# Patient Record
Sex: Male | Born: 1990 | Marital: Married | Smoking: Never smoker
Health system: Southern US, Community
[De-identification: ages and names within clinical notes are randomized; demographics above are authoritative.]

## PROBLEM LIST (undated history)

## (undated) DIAGNOSIS — L509 Urticaria, unspecified: Secondary | ICD-10-CM

## (undated) HISTORY — DX: Urticaria, unspecified: L50.9

---

## 2018-07-14 ENCOUNTER — Encounter: Payer: Self-pay | Admitting: Allergy and Immunology

## 2018-07-14 ENCOUNTER — Ambulatory Visit (INDEPENDENT_AMBULATORY_CARE_PROVIDER_SITE_OTHER): Payer: 59 | Admitting: Allergy and Immunology

## 2018-07-14 VITALS — BP 110/70 | Temp 98.5°F | Resp 18 | Ht 68.5 in | Wt 199.4 lb

## 2018-07-14 DIAGNOSIS — K2970 Gastritis, unspecified, without bleeding: Secondary | ICD-10-CM | POA: Diagnosis not present

## 2018-07-14 DIAGNOSIS — L500 Allergic urticaria: Secondary | ICD-10-CM

## 2018-07-14 DIAGNOSIS — J310 Chronic rhinitis: Secondary | ICD-10-CM | POA: Diagnosis not present

## 2018-07-14 DIAGNOSIS — T7840XA Allergy, unspecified, initial encounter: Secondary | ICD-10-CM | POA: Insufficient documentation

## 2018-07-14 DIAGNOSIS — T7840XD Allergy, unspecified, subsequent encounter: Secondary | ICD-10-CM

## 2018-07-14 MED ORDER — FAMOTIDINE 20 MG PO TABS: 20.0000 mg | ORAL_TABLET | Freq: Two times a day (BID) | ORAL | 5 refills | Status: DC

## 2018-07-14 MED ORDER — LEVOCETIRIZINE DIHYDROCHLORIDE 5 MG PO TABS: 5.0000 mg | ORAL_TABLET | Freq: Every evening | ORAL | 5 refills | Status: DC

## 2018-07-14 NOTE — Assessment & Plan Note (Signed)
Unclear etiology. Skin tests to select food allergens were negative today. NSAIDs and emotional stress commonly exacerbate urticaria but are not the underlying etiology in this case. Physical urticarias are negative by history (i.e. pressure-induced, temperature, vibration, solar, etc.). History and lesions are not consistent with urticaria pigmentosa so I am not suspicious for mastocytosis. There are no concomitant symptoms concerning for anaphylaxis or constitutional symptoms worrisome for an underlying malignancy. We will rule out other potential etiologies with labs. For symptom relief, patient is to take oral antihistamines as directed.  The following labs have been ordered: FCeRI antibody, anti-thyroglobulin antibody, thyroid peroxidase antibody, tryptase, urea breath test, CBC, CMP, ESR, ANA, and galactose-alpha-1,3-galactose IgE level.  The patient will be called with further recommendations after lab results have returned.  Instructions have been discussed and provided for H1/H2 receptor blockade with titration to find lowest effective dose.  A prescription has been provided for levocetirizine (Xyzal), 5 mg daily as needed.  A prescription has been provided for famotidine (Pepcid) 20 mg twice daily as needed.  Should there be a significant increase or change in symptoms, a journal is to be kept recording any foods eaten, beverages consumed, medications taken within a 6 hour period prior to the onset of symptoms, as well as record activities being performed, and environmental conditions. For any symptoms concerning for anaphylaxis, 911 is to be called immediately. 

## 2018-07-14 NOTE — Patient Instructions (Addendum)
Allergic urticaria Unclear etiology. Skin tests to select food allergens were negative today. NSAIDs and emotional stress commonly exacerbate urticaria but are not the underlying etiology in this case. Physical urticarias are negative by history (i.e. pressure-induced, temperature, vibration, solar, etc.). History and lesions are not consistent with urticaria pigmentosa so I am not suspicious for mastocytosis. There are no concomitant symptoms concerning for anaphylaxis or constitutional symptoms worrisome for an underlying malignancy. We will rule out other potential etiologies with labs. For symptom relief, patient is to take oral antihistamines as directed.  The following labs have been ordered: FCeRI antibody, anti-thyroglobulin antibody, thyroid peroxidase antibody, tryptase, urea breath test, CBC, CMP, ESR, ANA, and galactose-alpha-1,3-galactose IgE level.  The patient will be called with further recommendations after lab results have returned.  Instructions have been discussed and provided for H1/H2 receptor blockade with titration to find lowest effective dose.  A prescription has been provided for levocetirizine (Xyzal), 5 mg daily as needed.  A prescription has been provided for famotidine (Pepcid) 20 mg twice daily as needed.  Should there be a significant increase or change in symptoms, a journal is to be kept recording any foods eaten, beverages consumed, medications taken within a 6 hour period prior to the onset of symptoms, as well as record activities being performed, and environmental conditions. For any symptoms concerning for anaphylaxis, 911 is to be called immediately.  Chronic rhinitis  Fluticasone nasal spray (Flonase), 2 sprays per nostril daily daily as needed.   Nasal saline spray (i.e., Simply Saline) or nasal saline lavage (i.e., NeilMed) as needed and prior to medicated nasal sprays.   When lab results have returned the patient will be called with further  recommendations and follow up instructions.   Urticaria (Hives)  . Levocetirizine (Xyzal) 5 mg twice a day and famotidine (Pepcid) 20 mg twice a day. If no symptoms for 7-14 days then decrease to. . Levocetirizine (Xyzal) 5 mg twice a day and famotidine (Pepcid) 20 mg once a day.  If no symptoms for 7-14 days then decrease to. . Levocetirizine (Xyzal) 5 mg twice a day.  If no symptoms for 7-14 days then decrease to. . Levocetirizine (Xyzal) 5 mg once a day.  May use Benadryl (diphenhydramine) as needed for breakthrough symptoms       If symptoms return, then step up dosage

## 2018-07-14 NOTE — Assessment & Plan Note (Signed)
   Fluticasone nasal spray (Flonase), 2 sprays per nostril daily daily as needed.   Nasal saline spray (i.e., Simply Saline) or nasal saline lavage (i.e., NeilMed) as needed and prior to medicated nasal sprays.

## 2018-07-14 NOTE — Progress Notes (Signed)
New Patient Note  RE: Charles Guerra MRN: [id number] DOB: 13-Apr-1991 Date of Office Visit: 07/14/2018  Referring provider: Jilda Panda, MD Primary care provider: Jilda Panda, MD  Chief Complaint: Urticaria; Rash; and Pruritus   History of present illness: Charles Guerra is a 27 y.o. male seen today in consultation requested by Jilda Panda, MD.  Over the past 5 months, [name] has experienced recurrent episodes of hives. The hives have appeared at different times over his face, neck, chest, back, arms, legs and hands.  The lesions are described as erythematous, raised, and pruritic.  Individual hives last less than 24 hours without leaving residual pigmentation or bruising. He denies concomitant angioedema, cardiopulmonary symptoms and GI symptoms. He has not experienced unexpected weight loss, recurrent fevers or drenching night sweats. No specific medication, food, skin care product, detergent, soap, or other environmental triggers have been identified. Seems to be worse when hot and sweaty but can occur any time. The symptoms do not seem to correlate with NSAIDs use or emotional stress. He did not have symptoms consistent with a respiratory tract infection at the time of symptom onset. [place]on has not yet tried any medications to control symptoms  He has not been evaluated and treated in the emergency department for these symptoms. Skin biopsy has not been performed. Recently, he has been experiencing hives daily. The patient experiences occasional nasal congestion, rhinorrhea, and postnasal drainage.  No significant seasonal symptom variation has been noted nor have specific environmental triggers been identified.  He does not take medications in an attempt to control the symptoms.  He has no history of symptoms consistent with asthma.  Assessment and plan: Allergic urticaria Unclear etiology. Skin tests to select food allergens were negative today. NSAIDs and  emotional stress commonly exacerbate urticaria but are not the underlying etiology in this case. Physical urticarias are negative by history (i.e. pressure-induced, temperature, vibration, solar, etc.). History and lesions are not consistent with urticaria pigmentosa so I am not suspicious for mastocytosis. There are no concomitant symptoms concerning for anaphylaxis or constitutional symptoms worrisome for an underlying malignancy. We will rule out other potential etiologies with labs. For symptom relief, patient is to take oral antihistamines as directed.  The following labs have been ordered: FCeRI antibody, anti-thyroglobulin antibody, thyroid peroxidase antibody, tryptase, urea breath test, CBC, CMP, ESR, ANA, and galactose-alpha-1,3-galactose IgE level.  The patient will be called with further recommendations after lab results have returned.  Instructions have been discussed and provided for H1/H2 receptor blockade with titration to find lowest effective dose.  A prescription has been provided for levocetirizine (Xyzal), 5 mg daily as needed.  A prescription has been provided for famotidine (Pepcid) 20 mg twice daily as needed.  Should there be a significant increase or change in symptoms, a journal is to be kept recording any foods eaten, beverages consumed, medications taken within a 6 hour period prior to the onset of symptoms, as well as record activities being performed, and environmental conditions. For any symptoms concerning for anaphylaxis, 911 is to be called immediately.  Chronic rhinitis  Fluticasone nasal spray (Flonase), 2 sprays per nostril daily daily as needed.   Nasal saline spray (i.e., Simply Saline) or nasal saline lavage (i.e., NeilMed) as needed and prior to medicated nasal sprays.   Meds ordered this encounter  Medications  . levocetirizine (XYZAL) 5 MG tablet    Sig: Take 1 tablet (5 mg total) by mouth every evening.    Dispense:  30 tablet  Refill:  5  .  famotidine (PEPCID) 20 MG tablet    Sig: Take 1 tablet (20 mg total) by mouth 2 (two) times daily.    Dispense:  30 tablet    Refill:  5    Diagnostics: Environmental skin testing: Negative despite a positive histamine control. Food allergen skin testing: Negative despite a positive histamine control.    Physical examination: Blood pressure 110/70, temperature 98.5 F (36.9 C), temperature source Oral, resp. rate 18, height 5' 8.5" (1.74 m), weight 199 lb 6.4 oz (90.4 kg).  General: Alert, interactive, in no acute distress. HEENT: TMs pearly gray, turbinates mildly edematous without discharge, post-pharynx mildly erythematous. Neck: Supple without lymphadenopathy. Lungs: Clear to auscultation without wheezing, rhonchi or rales. CV: Normal S1, S2 without murmurs. Abdomen: Nondistended, nontender. Skin: Scattered erythematous patches and urticarial type lesions primarily located flanks and upper back , nonvesicular. Extremities:  No clubbing, cyanosis or edema. Neuro:   Grossly intact.  Review of systems:  Review of systems negative except as noted in HPI / PMHx or noted below: Review of Systems  Constitutional: Negative.   HENT: Negative.   Eyes: Negative.   Respiratory: Negative.   Cardiovascular: Negative.   Gastrointestinal: Negative.   Genitourinary: Negative.   Musculoskeletal: Negative.   Skin: Negative.   Neurological: Negative.   Endo/Heme/Allergies: Negative.   Psychiatric/Behavioral: Negative.     Past medical history:  Past Medical History:  Diagnosis Date  . Urticaria     Past surgical history:  History reviewed. No pertinent surgical history.  Family history: Family History  Problem Relation Age of Onset  . Allergic rhinitis Neg Hx   . Asthma Neg Hx   . Eczema Neg Hx   . Urticaria Neg Hx     Social history: Social History   Socioeconomic History  . Marital status: Married    Spouse name: Not on file  . Number of children: Not on file  .  Years of education: Not on file  . Highest education level: Not on file  Occupational History  . Not on file  Social Needs  . Financial resource strain: Not on file  . Food insecurity:    Worry: Not on file    Inability: Not on file  . Transportation needs:    Medical: Not on file    Non-medical: Not on file  Tobacco Use  . Smoking status: Never Smoker  . Smokeless tobacco: Never Used  Substance and Sexual Activity  . Alcohol use: Not on file  . Drug use: Not on file  . Sexual activity: Not on file  Lifestyle  . Physical activity:    Days per week: Not on file    Minutes per session: Not on file  . Stress: Not on file  Relationships  . Social connections:    Talks on phone: Not on file    Gets together: Not on file    Attends religious service: Not on file    Active member of club or organization: Not on file    Attends meetings of clubs or organizations: Not on file    Relationship status: Not on file  . Intimate partner violence:    Fear of current or ex partner: Not on file    Emotionally abused: Not on file    Physically abused: Not on file    Forced sexual activity: Not on file  Other Topics Concern  . Not on file  Social History Narrative  . Not on file  Environmental History: The patient lives in an apartment with carpeting throughout and central air/heat.  There is no known mold/water damage in the home.  He is a non-smoker without pets.  Allergies as of 07/14/2018      Reactions   Penicillins       Medication List        Accurate as of 07/14/18  5:30 PM. Always use your most recent med list.          cholecalciferol 1000 units tablet Commonly known as:  VITAMIN D Take 1,000 Units by mouth daily.   famotidine 20 MG tablet Commonly known as:  PEPCID Take 1 tablet (20 mg total) by mouth 2 (two) times daily.   levocetirizine 5 MG tablet Commonly known as:  XYZAL Take 1 tablet (5 mg total) by mouth every evening.   Vitamin D (Ergocalciferol)  50000 units Caps capsule Commonly known as:  DRISDOL Take 50,000 Units by mouth once a week.       Known medication allergies: Allergies  Allergen Reactions  . Penicillins     I appreciate the opportunity to take part in Pruitt's care. Please do not hesitate to contact me with questions.  Sincerely,   R. Edgar Frisk, MD

## 2018-07-22 LAB — H. PYLORI BREATH TEST: H pylori Breath Test: POSITIVE — AB

## 2018-07-23 ENCOUNTER — Telehealth: Payer: Self-pay | Admitting: *Deleted

## 2018-07-23 MED ORDER — OMEPRAZOLE 20 MG PO CPDR: DELAYED_RELEASE_CAPSULE | ORAL | 0 refills | Status: DC

## 2018-07-23 MED ORDER — CLARITHROMYCIN 500 MG PO TABS: ORAL_TABLET | ORAL | 0 refills | Status: DC

## 2018-07-23 MED ORDER — METRONIDAZOLE 500 MG PO TABS: ORAL_TABLET | ORAL | 0 refills | Status: DC

## 2018-07-23 NOTE — Telephone Encounter (Signed)
Patient return called in regards to lab work results reviewed medication regimen reviewed and prescriptions sent in. Patient verbalized understanding

## 2018-07-23 NOTE — Telephone Encounter (Signed)
-----   Message from Cristal Ford, MD sent at 07/22/2018  5:24 PM EDT ----- Please inform the patient that he is H. pylori positive.  This lab result came in after the other results have been returned.  His chart indicates that he is penicillin allergic.  Please confirm with the patient that this is true.  If he is penicillin allergic please provide prescriptions for omeprazole 20 mg twice a day 14 days, metronidazole 500 mg twice a day 14 days, and clarithromycin 500 mg twice a day 14 days.  Please send lab results to the patient's primary care physician and referring physician.  Thank you.

## 2018-07-25 LAB — ALPHA-GAL PANEL
Alpha Gal IgE*: <0.1 kU/L (ref <0.10)
Beef (Bos spp) IgE: <0.1 kU/L (ref <0.35)
Class Interpretation: 0
Class Interpretation: 0
Class Interpretation: 0
Lamb/Mutton (Ovis spp) IgE: <0.1 kU/L (ref <0.35)
Pork (Sus spp) IgE: <0.1 kU/L (ref <0.35)

## 2018-07-25 LAB — COMPREHENSIVE METABOLIC PANEL
ALT: 34 IU/L (ref 0–44)
AST: 26 IU/L (ref 0–40)
Albumin/Globulin Ratio: 2.3 — ABNORMAL HIGH (ref 1.2–2.2)
Albumin: 4.6 g/dL (ref 3.5–5.5)
Alkaline Phosphatase: 75 IU/L (ref 39–117)
BUN/Creatinine Ratio: 15 (ref 9–20)
BUN: 12 mg/dL (ref 6–20)
Bilirubin Total: 0.6 mg/dL (ref 0.0–1.2)
CO2: 21 mmol/L (ref 20–29)
Calcium: 9.2 mg/dL (ref 8.7–10.2)
Chloride: 102 mmol/L (ref 96–106)
Creatinine, Ser: 0.79 mg/dL (ref 0.76–1.27)
GFR calc Af Amer: 142 mL/min/{1.73_m2} (ref >59)
GFR calc non Af Amer: 123 mL/min/{1.73_m2} (ref >59)
Globulin, Total: 2 g/dL (ref 1.5–4.5)
Glucose: 90 mg/dL (ref 65–99)
Potassium: 4 mmol/L (ref 3.5–5.2)
Sodium: 140 mmol/L (ref 134–144)
Total Protein: 6.6 g/dL (ref 6.0–8.5)

## 2018-07-25 LAB — SEDIMENTATION RATE: Sed Rate: 4 mm/hr (ref 0–15)

## 2018-07-25 LAB — TRYPTASE: Tryptase: 7.1 ug/L (ref 2.2–13.2)

## 2018-07-25 LAB — CBC
Hematocrit: 47 % (ref 37.5–51.0)
Hemoglobin: 16 g/dL (ref 13.0–17.7)
MCH: 28.8 pg (ref 26.6–33.0)
MCHC: 34 g/dL (ref 31.5–35.7)
MCV: 85 fL (ref 79–97)
Platelets: 219 10*3/uL (ref 150–450)
RBC: 5.56 x10E6/uL (ref 4.14–5.80)
RDW: 12.7 % (ref 12.3–15.4)
WBC: 6.6 10*3/uL (ref 3.4–10.8)

## 2018-07-25 LAB — ANA: ANA Titer 1: NEGATIVE

## 2018-08-10 ENCOUNTER — Telehealth: Payer: Self-pay | Admitting: Allergy and Immunology

## 2018-08-10 NOTE — Telephone Encounter (Signed)
Pt called and said that he was no better and need to talk with someone. [phone] [facility]

## 2018-08-10 NOTE — Telephone Encounter (Signed)
While there is an association between hives and H. pylori, his hives may or may not have been caused by the H. pylori.  Either way, the H. pylori needed to be eradicated because it can lead to problems down the road.  There are many people that have hives unrelated to the H. pylori.  Therefore, he should use antihistamines as needed to help control the hives, titrating to the lowest dose necessary to keep the hives under control.  Urticaria (Hives)  . Levocetirizine (Xyzal) 5 mg twice a day and famotidine (Pepcid) 20 mg twice a day. If no symptoms for 7-14 days then decrease to. . Levocetirizine (Xyzal) 5 mg twice a day and famotidine (Pepcid) 20 mg once a day.  If no symptoms for 7-14 days then decrease to. . Levocetirizine (Xyzal) 5 mg twice a day.  If no symptoms for 7-14 days then decrease to. . Levocetirizine (Xyzal) 5 mg once a day.  May use Benadryl (diphenhydramine) as needed for breakthrough symptoms       If symptoms return, then step up dosage

## 2018-08-10 NOTE — Telephone Encounter (Signed)
Spoke with patient and he is still breaking out even after completing the treatment for H. Pylori. I asked patient if he was doing to the antihistamine regimen that Dr. Nunzio Cobbs suggested and he started he was taking the Levocetirizine every other night. I informed patient he should take it every day and see if things improve. Patient stated that those were for an allergic reaction and his underlying problem was H. Pylori and he didn't feel that was going to help his H. Pylori. Please advise on next step.

## 2018-08-14 NOTE — Telephone Encounter (Signed)
Left message for patient to call office.  

## 2018-08-14 NOTE — Telephone Encounter (Signed)
Spoke with patient and discussed H. Pylori and hives per Dr. Sheran Fava note.   Reviewed in detail urticaria and Levocetirizine and Pepcid along with when to increase and when to decrease medication.  Patient will call the office back if he can't find his copy of last after visit summary that has urticaria treatment written in detail by Dr. Nunzio Cobbs so we can mail him another copy.  Patient stated he was out of Levocetirizine and Pepcid and he was informed that there were refills sent in to pharmacy at time of original RX.  Patient will call pharmacy for refills.  Patient voiced understanding.

## 2018-09-03 ENCOUNTER — Ambulatory Visit (INDEPENDENT_AMBULATORY_CARE_PROVIDER_SITE_OTHER): Payer: 59 | Admitting: Allergy

## 2018-09-03 ENCOUNTER — Encounter: Payer: Self-pay | Admitting: Allergy

## 2018-09-03 VITALS — BP 100/66 | HR 100 | Resp 18

## 2018-09-03 DIAGNOSIS — L508 Other urticaria: Secondary | ICD-10-CM | POA: Insufficient documentation

## 2018-09-03 MED ORDER — FAMOTIDINE 20 MG PO TABS: 20.0000 mg | ORAL_TABLET | Freq: Two times a day (BID) | ORAL | 5 refills | Status: DC

## 2018-09-03 MED ORDER — LEVOCETIRIZINE DIHYDROCHLORIDE 5 MG PO TABS: 5.0000 mg | ORAL_TABLET | Freq: Every evening | ORAL | 5 refills | Status: DC

## 2018-09-03 NOTE — Patient Instructions (Addendum)
Continue levocetirizine 5mg  in the evening and levocetirizine 2.5mg  in the morning.  Take pepcid 20mg  twice a day  Avoid the following potential triggers: alcohol, tight clothing, NSAIDs.   Follow up in 4 weeks.

## 2018-09-03 NOTE — Assessment & Plan Note (Signed)
Past history - Unclear etiology. Skin tests to select food allergens were negative in 2019.  Labs 2019: tryptase, CBC, CMP, ESR, ANA, and galactose-alpha-1,3-galactose IgE level - all unremarkble. He did have elevated urea breath test and treated for H.pylori.  Currently on xyzal '5mg'$  QHS with good benefit however still gets few hives lasting about 30 minutes. Most likely has chronic idiopathic urticaria.  Continue levocetirizine '5mg'$  in the evening and levocetirizine 2.'5mg'$  in the morning.  Take pepcid '20mg'$  twice a day  Avoid the following potential triggers: alcohol, tight clothing, NSAIDs.

## 2018-09-03 NOTE — Progress Notes (Signed)
Follow Up Note  RE: Charles Guerra MRN: [id number] DOB: 1990/12/07 Date of Office Visit: 09/03/2018  Referring provider: Jilda Panda, MD Primary care provider: Jilda Panda, MD  Chief Complaint: Rash  History of Present Illness: I had the pleasure of seeing Teruo Stubbe for a follow up visit at the [facility] on 09/03/2018. He is a 27 y.o. male, who is being followed for urticaria, rhinitis. Today he is here for new complaint of rash. His previous allergy office visit was on 07/14/2018 with Dr. Verlin Fester.   Patient is still complaining of rash and pruritus. He did finish the H.pylori treatment and does not complain of any GI issues.   He is taking xyzal 69m daily at night and it helps with the pruritus. The hives usually take about 30 minutes to resolve. Xyzal does cause some drowsiness.   This has been going on for 5-6 months. Denies any changes in diet, medications. No triggers noted.   Assessment and Plan: HLambrosis a 27y.o. male with: Chronic urticaria Past history - Unclear etiology. Skin tests to select food allergens were negative in 2019.  Labs 2019: tryptase, CBC, CMP, ESR, ANA, and galactose-alpha-1,3-galactose IgE level - all unremarkble. He did have elevated urea breath test and treated for H.pylori.  Currently on xyzal 567mQHS with good benefit however still gets few hives lasting about 30 minutes. Most likely has chronic idiopathic urticaria.  Continue levocetirizine 51m67mn the evening and levocetirizine 2.51mg29m the morning.  Take pepcid 20mg31mce a day  Avoid the following potential triggers: alcohol, tight clothing, NSAIDs.  Return in about 4 weeks (around 10/01/2018).  Meds ordered this encounter  Medications  . levocetirizine (XYZAL) 5 MG tablet    Sig: Take 1 tablet (5 mg total) by mouth every evening. Take 1/2 tablet in the morning and 1 tablet in the evening.    Dispense:  45 tablet    Refill:  5  .  famotidine (PEPCID) 20 MG tablet    Sig: Take 1 tablet (20 mg total) by mouth 2 (two) times daily.    Dispense:  30 tablet    Refill:  5   Diagnostics: None.  Medication List:  Current Outpatient Medications  Medication Sig Dispense Refill  . levocetirizine (XYZAL) 5 MG tablet Take 1 tablet (5 mg total) by mouth every evening. Take 1/2 tablet in the morning and 1 tablet in the evening. 45 tablet 5  . famotidine (PEPCID) 20 MG tablet Take 1 tablet (20 mg total) by mouth 2 (two) times daily. 30 tablet 5   No current facility-administered medications for this visit.    Allergies: Allergies  Allergen Reactions  . Penicillins    I reviewed his past medical history, social history, family history, and environmental history and no significant changes have been reported from previous visit on 07/14/2018.  Review of Systems  Constitutional: Negative for appetite change, chills, fever and unexpected weight change.  HENT: Negative for congestion and rhinorrhea.   Eyes: Negative for itching.  Respiratory: Negative for cough, chest tightness, shortness of breath and wheezing.   Gastrointestinal: Negative for abdominal pain.  Skin: Positive for rash.  Allergic/Immunologic: Positive for environmental allergies.  Neurological: Negative for headaches.   Objective: BP 100/66 (BP Location: Right Arm, Patient Position: Sitting, Cuff Size: Normal)   Pulse 100   Resp 18   SpO2 96%  There is no height or weight on file to calculate BMI. Physical Exam  Constitutional: He  is oriented to person, place, and time. He appears well-developed and well-nourished.  HENT:  Head: Normocephalic and atraumatic.  Right Ear: External ear normal.  Left Ear: External ear normal.  Nose: Nose normal.  Mouth/Throat: Oropharynx is clear and moist.  Eyes: Conjunctivae and EOM are normal.  Neck: Neck supple.  Cardiovascular: Normal rate, regular rhythm and normal heart sounds. Exam reveals no gallop and no friction  rub.  No murmur heard. Pulmonary/Chest: Effort normal and breath sounds normal. He has no wheezes. He has no rales.  Neurological: He is alert and oriented to person, place, and time.  Skin: Skin is warm. No rash noted.  Psychiatric: He has a normal mood and affect. His behavior is normal.  Nursing note and vitals reviewed.  Previous notes and tests were reviewed. The plan was reviewed with the patient/family, and all questions/concerned were addressed.  It was my pleasure to see Charles Guerra today and participate in his care. Please feel free to contact me with any questions or concerns.  Sincerely,  Rexene Alberts, DO Allergy & Immunology  [facility] office: [phone] [place]

## 2018-10-01 ENCOUNTER — Ambulatory Visit (INDEPENDENT_AMBULATORY_CARE_PROVIDER_SITE_OTHER): Payer: 59 | Admitting: Allergy

## 2018-10-01 ENCOUNTER — Encounter: Payer: Self-pay | Admitting: Allergy

## 2018-10-01 VITALS — BP 104/82 | HR 89 | Resp 12

## 2018-10-01 DIAGNOSIS — L508 Other urticaria: Secondary | ICD-10-CM | POA: Diagnosis not present

## 2018-10-01 NOTE — Patient Instructions (Addendum)
Chronic urticaria   Take xyzal 5mg  at night and switch the morning dose to allegra 1/2 pill to see if it makes you tired.  Take pepcid 20mg  twice a day  Avoid the following potential triggers: alcohol, tight clothing, NSAIDs.  If no rash, itching or hives for 1 month then may try stop the morning antihistamine dose but continue the xyzal 5mg  at night.  Follow up in 3 months.

## 2018-10-01 NOTE — Assessment & Plan Note (Signed)
Past history - Unclear etiology. Skin tests to select food allergens were negative in 2019.  Labs 2019: tryptase, CBC, CMP, ESR, ANA, and galactose-alpha-1,3-galactose IgE level - all unremarkble. He did have elevated urea breath test and treated for H.pylori. Interim history - Doing well with below regimen but hives flare if misses a xyzal dose.   Take xyzal '5mg'$  at night and switch the morning dose to allegra 1/2 pill to see if it makes you tired.  Take pepcid '20mg'$  twice a day  Avoid the following potential triggers: alcohol, tight clothing, NSAIDs.  If no rash, itching or hives for 1 month then may try stop the morning antihistamine dose but continue the xyzal '5mg'$  at night.

## 2018-10-01 NOTE — Progress Notes (Signed)
Follow Up Note  RE: Charles Guerra MRN: [id number] DOB: 11-Jun-1991 Date of Office Visit: 10/01/2018  Referring provider: Jilda Panda, MD Primary care provider: Jilda Panda, MD  Chief Complaint: Urticaria  History of Present Illness: I had the pleasure of seeing Charles Guerra for a follow up visit at the [facility] on 10/01/2018. He is a 27 y.o. male, who is being followed for urticaria. Today he is here for regular follow up visit. His previous allergy office visit was on 09/03/2018 with Dr. Maudie Mercury.   Currently on Xyzal 2.3m in the morning and 528min the evening and doing well. However if he misses a dose then noticed that the hives return the same day. The morning dose does sometimes makes him tired. Still taking Pepcid 2033mID.  Assessment and Plan: HasKoi a [age]o. male with: Chronic urticaria Past history - Unclear etiology. Skin tests to select food allergens were negative in 2019.  Labs 2019: tryptase, CBC, CMP, ESR, ANA, and galactose-alpha-1,3-galactose IgE level - all unremarkble. He did have elevated urea breath test and treated for H.pylori. Interim history - Doing well with below regimen but hives flare if misses a xyzal dose.   Take xyzal 5mg16m night and switch the morning dose to allegra 1/2 pill to see if it makes you tired.  Take pepcid 20mg39mce a day  Avoid the following potential triggers: alcohol, tight clothing, NSAIDs.  If no rash, itching or hives for 1 month then may try stop the morning antihistamine dose but continue the xyzal 5mg a26might.  Return in about 3 months (around 12/31/2018).  Diagnostics: None.  Medication List:  Current Outpatient Medications  Medication Sig Dispense Refill  . famotidine (PEPCID) 20 MG tablet Take 1 tablet (20 mg total) by mouth 2 (two) times daily. 30 tablet 5  . levocetirizine (XYZAL) 5 MG tablet Take 1 tablet (5 mg total) by mouth every evening. Take 1/2 tablet in the  morning and 1 tablet in the evening. 45 tablet 5   No current facility-administered medications for this visit.    Allergies: Allergies  Allergen Reactions  . Penicillins    I reviewed his past medical history, social history, family history, and environmental history and no significant changes have been reported from previous visit on 09/03/2018.  Review of Systems  Constitutional: Negative for appetite change, chills, fever and unexpected weight change.  HENT: Negative for congestion and rhinorrhea.   Eyes: Negative for itching.  Respiratory: Negative for cough, chest tightness, shortness of breath and wheezing.   Gastrointestinal: Negative for abdominal pain.  Skin: Positive for rash.  Allergic/Immunologic: Positive for environmental allergies.  Neurological: Negative for headaches.   Objective: BP 104/82 (BP Location: Left Arm, Patient Position: Sitting, Cuff Size: Normal)   Pulse 89   Resp 12   SpO2 94%  There is no height or weight on file to calculate BMI. Physical Exam  Constitutional: He is oriented to person, place, and time. He appears well-developed and well-nourished.  HENT:  Head: Normocephalic and atraumatic.  Right Ear: External ear normal.  Left Ear: External ear normal.  Nose: Nose normal.  Mouth/Throat: Oropharynx is clear and moist.  Eyes: Conjunctivae and EOM are normal.  Neck: Neck supple.  Cardiovascular: Normal rate, regular rhythm and normal heart sounds. Exam reveals no gallop and no friction rub.  No murmur heard. Pulmonary/Chest: Effort normal and breath sounds normal. He has no wheezes. He has no rales.  Neurological: He  is alert and oriented to person, place, and time.  Skin: Skin is warm. No rash noted.  Psychiatric: He has a normal mood and affect. His behavior is normal.  Nursing note and vitals reviewed.  Previous notes and tests were reviewed. The plan was reviewed with the patient/family, and all questions/concerned were  addressed.  It was my pleasure to see Charles Guerra today and participate in his care. Please feel free to contact me with any questions or concerns.  Sincerely,  Rexene Alberts, DO Allergy & Immunology  [facility] office: [phone] [facility] office: [phone]

## 2018-12-28 ENCOUNTER — Ambulatory Visit: Payer: 59 | Admitting: Allergy

## 2019-07-24 ENCOUNTER — Other Ambulatory Visit: Payer: Self-pay | Admitting: Allergy and Immunology

## 2019-07-26 NOTE — Telephone Encounter (Signed)
Gave 1 courtesy refill of levocetirizine- pt needs ov for any additional refills.

## 2019-08-12 ENCOUNTER — Telehealth: Payer: Self-pay | Admitting: Allergy and Immunology

## 2019-08-12 NOTE — Telephone Encounter (Signed)
Pt called and is having a lot problems with the rash and itching and his eyes are red and itching too. He said that he has been paying a bill with [place] but he did not get well. He is a little frustrated each month the bill comes in and he still not any better. I told him we would see if we can find out what is going on with him. The pharmacy [facility] on [address] [phone].

## 2019-08-12 NOTE — Telephone Encounter (Signed)
Patsy, it looks like Dr. Maudie Mercury has seen this patient the last 2 times.

## 2019-08-17 NOTE — Telephone Encounter (Signed)
Patient is calling to request a telephone call from the doctor. He verbalizes that he received a $1700 bill and feels that he did not receive any benefit from his 2019 visits to our practice. He refuses to schedule an office visit at this time verbalizing that he will not pay for it. He would strongly like to receive a call from one of the two doctors that he saw to discuss this matter. Please advise.

## 2019-08-17 NOTE — Telephone Encounter (Signed)
Please Advise

## 2019-08-17 NOTE — Telephone Encounter (Signed)
Dr. Kim please advise 

## 2019-08-18 NOTE — Telephone Encounter (Signed)
Charles Guerra,  Can you look into this?  I last saw him for regular OV on 10/01/2018 and 09/03/2018. I did not order any type of testing at that visit. Initial work up was done on 07/14/2018 by Dr. Verlin Fester.  It's been almost a year since the last follow up. I did recommend a follow up in 3 months after the last visit. I'm not sure how to address this.  Thank you.

## 2019-08-24 NOTE — Telephone Encounter (Signed)
Based on the initial evaluation, lab work was significant only for a positive H. pylori.  The H. pylori was treated based upon my recommendation.  I never saw him after that, Dr. Maudie Mercury did.  It appears that there are options, such as Xolair, which have not been explored.  It probably should have been explained to him as well that if he has chronic idiopathic urticaria there is no "cure", just a hope for remission.

## 2019-08-24 NOTE — Telephone Encounter (Signed)
Patient was in my referral workqueue for [facility]. I reached out to patient to schedule a follow up visit. He is very upset and says that he was told a few weeks ago that he would get a call back from Dr.Bobbitt regarding his care plan. Patient states that he received a bill for $1700.00 and he doesn't feel like he should have to pay it when he's had no relief what so ever. Patient says that he is not scheduling a follow up just to received another large bill when our treatment plan didn't work in the first place. I reassured the patient that the physicians are aware of his call and hopefully he will hear something soon. I asked him if he wanted me to close out his referral since he is refusing an appointment and he hung up on me.

## 2019-08-24 NOTE — Telephone Encounter (Signed)
I'm confused why he was told he would receive a call back from me when Dr. Maudie Mercury has seen him the last 2 times. Does the bill have to do with the initial visit or is it the cumulative bill?

## 2020-11-23 ENCOUNTER — Ambulatory Visit: Payer: Self-pay

## 2020-11-24 ENCOUNTER — Ambulatory Visit: Payer: Self-pay

## 2021-01-18 ENCOUNTER — Encounter: Payer: Self-pay | Admitting: Infectious Disease

## 2021-01-18 ENCOUNTER — Other Ambulatory Visit: Payer: Self-pay

## 2021-01-18 ENCOUNTER — Ambulatory Visit (INDEPENDENT_AMBULATORY_CARE_PROVIDER_SITE_OTHER): Payer: Managed Care, Other (non HMO) | Admitting: Infectious Disease

## 2021-01-18 DIAGNOSIS — R1013 Epigastric pain: Secondary | ICD-10-CM

## 2021-01-18 DIAGNOSIS — R059 Cough, unspecified: Secondary | ICD-10-CM

## 2021-01-18 DIAGNOSIS — J029 Acute pharyngitis, unspecified: Secondary | ICD-10-CM | POA: Diagnosis not present

## 2021-01-18 DIAGNOSIS — A048 Other specified bacterial intestinal infections: Secondary | ICD-10-CM

## 2021-01-18 DIAGNOSIS — J310 Chronic rhinitis: Secondary | ICD-10-CM

## 2021-01-18 DIAGNOSIS — Z880 Allergy status to penicillin: Secondary | ICD-10-CM

## 2021-01-18 HISTORY — DX: Cough, unspecified: R05.9

## 2021-01-18 HISTORY — DX: Acute pharyngitis, unspecified: J02.9

## 2021-01-18 HISTORY — DX: Other specified bacterial intestinal infections: A04.8

## 2021-01-18 LAB — POC COVID19 BINAXNOW: SARS Coronavirus 2 Ag: NEGATIVE

## 2021-01-18 NOTE — Progress Notes (Signed)
Subjective:  Reason for infectious disease consult recurrent Helicobacter pylori infection  Requesting physician: Jeani Hawking, MD  Chief complaint dyspepsia long with sore throat and a cough  Patient ID: Charles Guerra, male    DOB: 02/23/1991, 30 y.o.   MRN: [id number]  HPI   Charles Guerra is very pleasant man born in [place] who has had trouble with recurrent, persistent Helicobacter pylori infection since 2011.  He does not recall what he was treated with in 2011 but states that in 2017 he was treated with levofloxacin Flagyl omeprazole and bismuth this was while he was living in [place].  More recently he has been living in [place].  While here he has been diagnosed with H. pylori in 2019 and treated with therapy at that time which he believes might of involved tetracycline bismuth PPI metronidazole.  He was again diagnosed with H. pylori this September when he underwent EGD with biopsy was positive for H. pylori.  He was treated with quadruple therapy for 14 days.  Not clear to me what he was treated with then there was mention of treating with third line with levofloxacin PPI metronidazole.  There is note in the follow-up of medications that were active that were bismuth subsalicylate metronidazole omeprazole and doxycycline.  While he said he had an allergic reaction to penicillin when he was a child with swelling of his eyes and a rash he has taken amoxicillin and has had amoxicillin several times as part of regimens to treat H. pylori.  Still suffering from dyspepsia today and says he has trouble digesting things.  When he came to clinic he also first onset of cough today and a sore throat which he believes is due to seasonal allergies.  We did a rapid Covid antigen test here in the clinic which was negative.  Past Medical History:  Diagnosis Date  . Helicobacter pylori (H. pylori) infection 01/18/2021  . Urticaria     No past surgical history on file.  Family  History  Problem Relation Age of Onset  . Allergic rhinitis Neg Hx   . Asthma Neg Hx   . Eczema Neg Hx   . Urticaria Neg Hx       Social History   Socioeconomic History  . Marital status: Married    Spouse name: Not on file  . Number of children: Not on file  . Years of education: Not on file  . Highest education level: Not on file  Occupational History  . Not on file  Tobacco Use  . Smoking status: Never Smoker  . Smokeless tobacco: Never Used  Vaping Use  . Vaping Use: Never used  Substance and Sexual Activity  . Alcohol use: Not on file  . Drug use: Not on file  . Sexual activity: Not on file  Other Topics Concern  . Not on file  Social History Narrative  . Not on file   Social Determinants of Health   Financial Resource Strain: Not on file  Food Insecurity: Not on file  Transportation Needs: Not on file  Physical Activity: Not on file  Stress: Not on file  Social Connections: Not on file    Allergies  Allergen Reactions  . Penicillins      Current Outpatient Medications:  .  famotidine (PEPCID) 20 MG tablet, Take 1 tablet (20 mg total) by mouth 2 (two) times daily., Disp: 30 tablet, Rfl: 5 .  levocetirizine (XYZAL) 5 MG tablet, TAKE 1 TABLET(5 MG) BY MOUTH EVERY EVENING, Disp:  30 tablet, Rfl: 0   Review of Systems  Constitutional: Negative for activity change, appetite change, chills, diaphoresis, fatigue, fever and unexpected weight change.  HENT: Positive for sore throat. Negative for congestion, rhinorrhea, sinus pressure, sneezing and trouble swallowing.   Eyes: Negative for photophobia and visual disturbance.  Respiratory: Positive for cough. Negative for chest tightness, shortness of breath, wheezing and stridor.   Cardiovascular: Negative for chest pain, palpitations and leg swelling.  Gastrointestinal: Positive for abdominal pain. Negative for abdominal distention, anal bleeding, blood in stool, constipation, diarrhea, nausea and vomiting.   Genitourinary: Negative for difficulty urinating, dysuria, flank pain and hematuria.  Musculoskeletal: Negative for arthralgias, back pain, gait problem, joint swelling and myalgias.  Skin: Negative for color change, pallor, rash and wound.  Neurological: Negative for dizziness, tremors, weakness and light-headedness.  Hematological: Negative for adenopathy. Does not bruise/bleed easily.  Psychiatric/Behavioral: Negative for agitation, behavioral problems, confusion, decreased concentration, dysphoric mood and sleep disturbance.       Objective:   Physical Exam Constitutional:      General: He is not in acute distress.    Appearance: Normal appearance. He is well-developed. He is not ill-appearing or diaphoretic.  HENT:     Head: Normocephalic and atraumatic.     Right Ear: Hearing and external ear normal.     Left Ear: Hearing and external ear normal.     Nose: No nasal deformity or rhinorrhea.  Eyes:     General: No scleral icterus.    Extraocular Movements: Extraocular movements intact.     Conjunctiva/sclera: Conjunctivae normal.     Right eye: Right conjunctiva is not injected.     Left eye: Left conjunctiva is not injected.  Neck:     Vascular: No JVD.  Cardiovascular:     Rate and Rhythm: Normal rate and regular rhythm.     Heart sounds: S1 normal and S2 normal. Friction rub present.  Pulmonary:     Effort: Pulmonary effort is normal. No respiratory distress.  Abdominal:     General: Bowel sounds are normal. There is no distension.     Palpations: Abdomen is soft.  Musculoskeletal:        General: Normal range of motion.     Right shoulder: Normal.     Left shoulder: Normal.     Cervical back: Normal range of motion and neck supple.     Right hip: Normal.     Left hip: Normal.     Right knee: Normal.     Left knee: Normal.  Lymphadenopathy:     Head:     Right side of head: No submandibular, preauricular or posterior auricular adenopathy.     Left side of head:  No submandibular, preauricular or posterior auricular adenopathy.     Cervical: No cervical adenopathy.     Right cervical: No superficial or deep cervical adenopathy.    Left cervical: No superficial or deep cervical adenopathy.  Skin:    General: Skin is warm and dry.     Coloration: Skin is not pale.     Findings: No abrasion, bruising, ecchymosis, erythema, lesion or rash.     Nails: There is no clubbing.  Neurological:     Mental Status: He is alert and oriented to person, place, and time.     Sensory: No sensory deficit.     Coordination: Coordination normal.     Gait: Gait normal.  Psychiatric:        Attention and Perception: He is  attentive.        Mood and Affect: Mood normal.        Speech: Speech normal.        Behavior: Behavior normal. Behavior is cooperative.        Thought Content: Thought content normal.        Judgment: Judgment normal.           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  I have asked Sharin Mons whenever infectious disease pharmacist to review the regimens he was prescribed at the pharmacy [facility] that he has been feeling in [place] since 2018.   Labs we can come up with a salvage regimen to attempt with him.  I think ultimately though the best option for him to obtain a rational regimen will need to be a repeat EGD with biopsy for Helicobacter pylori on specialized media.  I have previously had difficulty being able to have this accomplished here in [place] working with local gastroenterologist and with her local labs though my partner Dr. Orvan Falconer has been successful on one occasion recently with sending a specimen for culture and sensitivity testing.  Cough and sore throat: Tested for Covid by rapid antigen which was negative and PCR sent this is indeed likely due to seasonal allergies  Penicillin allergy: Unlikely to be penicillin allergic anymore he  certainly has been tolerating amoxicillin by his account.  I spent greater than 80 minutes with the patient including greater than 50% of time in face to face counsel of the patient re the above problems, testing for COVID 19  and in coordination of his care with ID pharmacy.

## 2021-01-18 NOTE — Patient Instructions (Signed)
Our ID pharmacist will work on constructing your antibiotic history and together we hope we can construct a regimen for you to take. I suspect we will need Dr. Elnoria Howard to perform EGD with biopsy for H pylori and send for specialized culture with sensitivities

## 2021-01-19 ENCOUNTER — Other Ambulatory Visit (HOSPITAL_COMMUNITY): Payer: Self-pay

## 2021-01-19 ENCOUNTER — Telehealth (HOSPITAL_COMMUNITY): Payer: Self-pay | Admitting: Pharmacist

## 2021-01-19 LAB — SARS-COV-2 RNA,(COVID-19) QUALITATIVE NAAT: SARS CoV2 RNA: NOT DETECTED

## 2021-01-19 NOTE — Progress Notes (Signed)
Infectious Diseases Pharmacy Note   I was asked by Dr. Daiva Eves to gather a H pylori treatment history for Charles Guerra. I spoke with [facility] on [address] and they faxed me his medication profile. Unfortunately their records only go back 2 years.    From his pharmacy records it appears that he has received the following H pylori treatment regimens:  08/07/20 Levofloxacin 500 mg daily + Metronidazole 500mg  BID + PPI X 14 days   06/22/20  Bismouth subsalicylate + Doxycycline 100 mg BID + Metronidazole 500 MG BID+ PPI  I spoke with Dr. 08/22/20 and we will try another salvage regimen with rifabutin.   Plan Rifabutin 300 mg daily + Amoxicillin 750 mg TID + Omeprazole 40 BID  Our Pharmacy Technician, Daiva Eves, checked his copay on rifabutin and it will be $5.00.    [name], PharmD, BCPS, BCIDP Infectious Diseases Clinical Pharmacist Phone: [phone] 01/19/2021 12:48 PM

## 2021-01-23 ENCOUNTER — Other Ambulatory Visit: Payer: Self-pay | Admitting: Infectious Disease

## 2021-01-23 MED ORDER — AMOXICILLIN 250 MG PO CAPS: 750.0000 mg | ORAL_CAPSULE | Freq: Two times a day (BID) | ORAL | 0 refills | Status: AC

## 2021-01-23 MED ORDER — OMEPRAZOLE 40 MG PO CPDR: 40.0000 mg | DELAYED_RELEASE_CAPSULE | Freq: Two times a day (BID) | ORAL | 1 refills | Status: DC

## 2021-01-23 MED ORDER — RIFABUTIN 150 MG PO CAPS: 300.0000 mg | ORAL_CAPSULE | Freq: Every day | ORAL | 0 refills | Status: AC

## 2021-01-23 NOTE — Telephone Encounter (Signed)
Patient advised that the 3 medications have been sent to the pharmacy for his H pylori. Patient also instructed on how to take all 3 medications for 14 days. Patient verbalized understanding.

## 2021-02-05 ENCOUNTER — Telehealth: Payer: Self-pay

## 2021-02-05 NOTE — Telephone Encounter (Signed)
Patient called office today stating he stopped taking his Amoxicillin and Rifabutin on 4/28. States that he is currently fasting and has six days left. States that while on medication he noticed dizziness and body shakes.  Patient would like to know if provider is okay with him holding off on taking medication until fasting ends. Patient feels like he would be able to tolerate medication after eating.  Will forward message to provider.

## 2021-02-06 NOTE — Telephone Encounter (Signed)
Patient advised of Dr. Zenaida Niece Dam's advise. Patient understands that he can restart his medication after his fasting periods ends.  Charles Guerra

## 2021-02-06 NOTE — Telephone Encounter (Signed)
Left voicemail requesting patient call office back regarding discussion from 4/25. Will update patient with provider's response once he calls.

## 2021-02-20 ENCOUNTER — Ambulatory Visit: Payer: Managed Care, Other (non HMO) | Admitting: Infectious Disease

## 2021-03-21 ENCOUNTER — Encounter: Payer: Self-pay | Admitting: Infectious Disease

## 2021-03-21 ENCOUNTER — Ambulatory Visit: Payer: Managed Care, Other (non HMO) | Admitting: Infectious Disease

## 2021-03-21 ENCOUNTER — Other Ambulatory Visit: Payer: Self-pay

## 2021-03-21 VITALS — BP 118/77 | HR 95 | Temp 98.2°F | Resp 18 | Wt 209.0 lb

## 2021-03-21 DIAGNOSIS — T7840XD Allergy, unspecified, subsequent encounter: Secondary | ICD-10-CM

## 2021-03-21 DIAGNOSIS — A048 Other specified bacterial intestinal infections: Secondary | ICD-10-CM | POA: Diagnosis not present

## 2021-03-21 MED ORDER — AMOXICILLIN 500 MG PO CAPS: 1000.0000 mg | ORAL_CAPSULE | Freq: Three times a day (TID) | ORAL | 0 refills | Status: AC

## 2021-03-21 MED ORDER — OMEPRAZOLE 40 MG PO CPDR: 40.0000 mg | DELAYED_RELEASE_CAPSULE | Freq: Two times a day (BID) | ORAL | 0 refills | Status: DC

## 2021-03-21 MED ORDER — RIFAMPIN 300 MG PO CAPS: 300.0000 mg | ORAL_CAPSULE | Freq: Every day | ORAL | 0 refills | Status: AC

## 2021-03-21 NOTE — Progress Notes (Signed)
Subjective:    Chief complaint: still with dyspepsia, heart burn  Chief complaint dyspepsia long with sore throat and a cough  Patient ID: Charles Guerra, male    DOB: Apr 10, 1991, 30 y.o.   MRN: [id number]  HPI   Charles Guerra is very pleasant man born in [place] who has had trouble with recurrent, persistent Helicobacter pylori infection since 2011.  He does not recall what he was treated with in 2011 but states that in 2017 he was treated with levofloxacin Flagyl omeprazole and bismuth this was while he was living in [place].  More recently he has been living in [place].  While here he has been diagnosed with H. pylori in 2019 and treated with therapy at that time which he believes might of involved tetracycline bismuth PPI metronidazole.  He was again diagnosed with H. pylori this September 20221 when he underwent EGD with biopsy was positive for H. pylori.  He was treated with quadruple therapy for 14 days.  Not clear to me what he was treated with then there was mention of treating with third line with levofloxacin PPI metronidazole.  There is note in the follow-up of medications that were active that were bismuth subsalicylate metronidazole omeprazole and doxycycline.  While he said he had an allergic reaction to penicillin when he was a child with swelling of his eyes and a rash he has taken amoxicillin and has had amoxicillin several times as part of regimens to treat H. pylori.  He was still suffering from dyspepsia when I saw him in clinic.  We prescribed regimen of high dose amoxicillin and rifabutin along with high dose PPI but while he began the regimen and got 4 days into it, Ramadan began and he found he could not tolerate it without food. He then resumed it after Ramadan.  He is still having dyspepsia, heart burn, discomfort.  Past Medical History:  Diagnosis Date  . Cough 01/18/2021  . Helicobacter pylori (H. pylori) infection 01/18/2021  . Sore throat 01/18/2021   . Urticaria     No past surgical history on file.  Family History  Problem Relation Age of Onset  . Allergic rhinitis Neg Hx   . Asthma Neg Hx   . Eczema Neg Hx   . Urticaria Neg Hx       Social History   Socioeconomic History  . Marital status: Married    Spouse name: Not on file  . Number of children: Not on file  . Years of education: Not on file  . Highest education level: Not on file  Occupational History  . Not on file  Tobacco Use  . Smoking status: Never Smoker  . Smokeless tobacco: Never Used  Vaping Use  . Vaping Use: Never used  Substance and Sexual Activity  . Alcohol use: Not on file  . Drug use: Not on file  . Sexual activity: Not on file  Other Topics Concern  . Not on file  Social History Narrative  . Not on file   Social Determinants of Health   Financial Resource Strain: Not on file  Food Insecurity: Not on file  Transportation Needs: Not on file  Physical Activity: Not on file  Stress: Not on file  Social Connections: Not on file    Allergies  Allergen Reactions  . Penicillins Other (See Comments)    He had swelling of his eyes along with a rash as a child but has tolerated amoxicillin several times as an adult  Current Outpatient Medications:  .  amoxicillin (AMOXIL) 500 MG capsule, Take 2 capsules (1,000 mg total) by mouth 3 (three) times daily for 14 days., Disp: 84 capsule, Rfl: 0 .  omeprazole (PRILOSEC) 40 MG capsule, Take 1 capsule (40 mg total) by mouth in the morning and at bedtime for 14 days., Disp: 28 capsule, Rfl: 0 .  rifampin (RIFADIN) 300 MG capsule, Take 1 capsule (300 mg total) by mouth daily for 14 days., Disp: 14 capsule, Rfl: 0 .  famotidine (PEPCID) 20 MG tablet, Take 1 tablet (20 mg total) by mouth 2 (two) times daily. (Patient not taking: Reported on 03/21/2021), Disp: 30 tablet, Rfl: 5 .  levocetirizine (XYZAL) 5 MG tablet, TAKE 1 TABLET(5 MG) BY MOUTH EVERY EVENING (Patient not taking: Reported on 03/21/2021),  Disp: 30 tablet, Rfl: 0   Review of Systems  Constitutional: Negative for activity change, appetite change, chills, diaphoresis, fatigue, fever and unexpected weight change.  HENT: Negative for congestion, rhinorrhea, sinus pressure, sneezing, sore throat and trouble swallowing.   Eyes: Negative for photophobia and visual disturbance.  Respiratory: Negative for cough, chest tightness, shortness of breath, wheezing and stridor.   Cardiovascular: Negative for chest pain, palpitations and leg swelling.  Gastrointestinal: Positive for abdominal pain. Negative for abdominal distention, anal bleeding, blood in stool, constipation, diarrhea, nausea and vomiting.  Genitourinary: Negative for difficulty urinating, dysuria, flank pain and hematuria.  Musculoskeletal: Negative for arthralgias, back pain, gait problem, joint swelling and myalgias.  Skin: Negative for color change, pallor, rash and wound.  Neurological: Negative for dizziness, tremors, weakness and light-headedness.  Hematological: Negative for adenopathy. Does not bruise/bleed easily.  Psychiatric/Behavioral: Negative for agitation, behavioral problems, confusion, decreased concentration, dysphoric mood and sleep disturbance.       Objective:   Physical Exam Constitutional:      General: He is not in acute distress.    Appearance: Normal appearance. He is well-developed. He is not ill-appearing or diaphoretic.  HENT:     Head: Normocephalic and atraumatic.     Right Ear: Hearing and external ear normal.     Left Ear: Hearing and external ear normal.     Nose: No nasal deformity or rhinorrhea.  Eyes:     General: No scleral icterus.    Extraocular Movements: Extraocular movements intact.     Conjunctiva/sclera: Conjunctivae normal.     Right eye: Right conjunctiva is not injected.     Left eye: Left conjunctiva is not injected.  Neck:     Vascular: No JVD.  Cardiovascular:     Rate and Rhythm: Normal rate and regular rhythm.      Heart sounds: S1 normal and S2 normal.  Pulmonary:     Effort: Pulmonary effort is normal. No respiratory distress.  Abdominal:     General: There is no distension.     Palpations: Abdomen is soft.  Musculoskeletal:        General: Normal range of motion.     Right shoulder: Normal.     Left shoulder: Normal.     Cervical back: Normal range of motion and neck supple.     Right hip: Normal.     Left hip: Normal.     Right knee: Normal.     Left knee: Normal.  Lymphadenopathy:     Head:     Right side of head: No submandibular, preauricular or posterior auricular adenopathy.     Left side of head: No submandibular, preauricular or posterior auricular adenopathy.  Cervical: No cervical adenopathy.     Right cervical: No superficial or deep cervical adenopathy.    Left cervical: No superficial or deep cervical adenopathy.  Skin:    General: Skin is warm and dry.     Coloration: Skin is not pale.     Findings: No abrasion, bruising, ecchymosis, erythema, lesion or rash.     Nails: There is no clubbing.  Neurological:     General: No focal deficit present.     Mental Status: He is alert and oriented to person, place, and time.     Sensory: No sensory deficit.     Coordination: Coordination normal.     Gait: Gait normal.  Psychiatric:        Attention and Perception: He is attentive.        Mood and Affect: Mood normal.        Speech: Speech normal.        Behavior: Behavior normal. Behavior is cooperative.        Thought Content: Thought content normal.        Judgment: Judgment normal.           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  We will repeat high dose amoxicillin this time 1 g po TID with rifabutin 300mg  daily and high dose PPI (not clear he actually filled this latter one last time.  If this fails ultimately the mayneed to be a repeat EGD with biopsy for Helicobacter pylori on  specialized media.  I have previously had difficulty being able to have this accomplished here in [place] working with local gastroenterologist and with her local labs though my partner Dr. Waterford has been successful on one occasion recently with sending a specimen for culture and sensitivity testing.   Penicillin allergy: Unlikely to be penicillin allergic anymore he certainly has been tolerating amoxicillin by his account.  I spent more than 30 minutes with the patient including greater than 50% of time in face to face counseling of the patient personally reviewing radiographs, along with pertinent laboratory microbiological data review of medical records and in coordination of his care.

## 2021-05-22 ENCOUNTER — Ambulatory Visit: Payer: Managed Care, Other (non HMO) | Admitting: Infectious Disease

## 2021-05-22 ENCOUNTER — Other Ambulatory Visit: Payer: Self-pay

## 2021-05-22 VITALS — BP 109/70 | HR 69 | Temp 98.2°F | Ht 71.0 in | Wt 200.0 lb

## 2021-05-22 DIAGNOSIS — A048 Other specified bacterial intestinal infections: Secondary | ICD-10-CM

## 2021-05-22 DIAGNOSIS — T7840XD Allergy, unspecified, subsequent encounter: Secondary | ICD-10-CM | POA: Diagnosis not present

## 2021-05-22 NOTE — Progress Notes (Signed)
Subjective:   Chief complaint: Follow-up for Helicobacter pylori infection   Patient ID: Charles Guerra, male    DOB: 06/16/1991, 30 y.o.   MRN: [id number]  HPI   Charles Guerra is very pleasant man born in [place] who has had trouble with recurrent, persistent Helicobacter pylori infection since 2011.  He does not recall what he was treated with in 2011 but states that in 2017 he was treated with levofloxacin Flagyl omeprazole and bismuth this was while he was living in [place].  More recently he has been living in [place].  While here he has been diagnosed with H. pylori in 2019 and treated with therapy at that time which he believes might of involved tetracycline bismuth PPI metronidazole.  He was again diagnosed with H. pylori this September 20221 when he underwent EGD with biopsy was positive for H. pylori.  He was treated with quadruple therapy for 14 days.  Not clear to me what he was treated with then there was mention of treating with third line with levofloxacin PPI metronidazole.  There is note in the follow-up of medications that were active that were bismuth subsalicylate metronidazole omeprazole and doxycycline.  While he said he had an allergic reaction to penicillin when he was a child with swelling of his eyes and a rash he has taken amoxicillin and has had amoxicillin several times as part of regimens to treat H. pylori.  He was still suffering from dyspepsia when I saw him in clinic.  We prescribed regimen of high dose amoxicillin and rifabutin along with high dose PPI but while he began the regimen and got 4 days into it, Ramadan began and he found he could not tolerate it without food. He then resumed it after Ramadan.  He is still having dyspepsia, heart burn, discomfort at his last visit with me.  We repeated a course of high-dose amoxicillin at 1 g 3 times daily along with rifabutin 300 mg daily and high-dose proton pump inhibitor.   He has completed the  therapy and actually now has complete resolution of his dyspepsia.  This gives me hope that we have perhaps cured his Helicobacter pylori infection.  Past Medical History:  Diagnosis Date   Cough 01/18/2021   Helicobacter pylori (H. pylori) infection 01/18/2021   Sore throat 01/18/2021   Urticaria     No past surgical history on file.  Family History  Problem Relation Age of Onset   Allergic rhinitis Neg Hx    Asthma Neg Hx    Eczema Neg Hx    Urticaria Neg Hx       Social History   Socioeconomic History   Marital status: Married    Spouse name: Not on file   Number of children: Not on file   Years of education: Not on file   Highest education level: Not on file  Occupational History   Not on file  Tobacco Use   Smoking status: Never   Smokeless tobacco: Never  Vaping Use   Vaping Use: Never used  Substance and Sexual Activity   Alcohol use: Not on file   Drug use: Not on file   Sexual activity: Not on file  Other Topics Concern   Not on file  Social History Narrative   Not on file   Social Determinants of Health   Financial Resource Strain: Not on file  Food Insecurity: Not on file  Transportation Needs: Not on file  Physical Activity: Not on file  Stress: Not on  file  Social Connections: Not on file    Allergies  Allergen Reactions   Penicillins Other (See Comments)    He had swelling of his eyes along with a rash as a child but has tolerated amoxicillin several times as an adult     Current Outpatient Medications:    levocetirizine (XYZAL) 5 MG tablet, TAKE 1 TABLET(5 MG) BY MOUTH EVERY EVENING, Disp: 30 tablet, Rfl: 0   montelukast (SINGULAIR) 10 MG tablet, SMARTSIG:1 Tablet(s) By Mouth Every Evening, Disp: , Rfl:    Vitamin D, Ergocalciferol, (DRISDOL) 1.25 MG (50000 UNIT) CAPS capsule, Take 50,000 Units by mouth every 7 (seven) days., Disp: , Rfl:    Review of Systems  Constitutional:  Negative for activity change, appetite change, chills,  diaphoresis, fatigue, fever and unexpected weight change.  HENT:  Negative for congestion, rhinorrhea, sinus pressure, sneezing, sore throat and trouble swallowing.   Eyes:  Negative for photophobia and visual disturbance.  Respiratory:  Negative for cough, chest tightness, shortness of breath, wheezing and stridor.   Cardiovascular:  Negative for chest pain, palpitations and leg swelling.  Gastrointestinal:  Negative for abdominal distention, abdominal pain, anal bleeding, blood in stool, constipation, diarrhea, nausea and vomiting.  Genitourinary:  Negative for difficulty urinating, dysuria, flank pain and hematuria.  Musculoskeletal:  Negative for arthralgias, back pain, gait problem, joint swelling and myalgias.  Skin:  Negative for color change, pallor, rash and wound.  Neurological:  Negative for dizziness, tremors, weakness and light-headedness.  Hematological:  Negative for adenopathy. Does not bruise/bleed easily.  Psychiatric/Behavioral:  Negative for agitation, behavioral problems, confusion, decreased concentration, dysphoric mood and sleep disturbance.       Objective:   Physical Exam Constitutional:      Appearance: He is well-developed.  HENT:     Head: Normocephalic and atraumatic.  Eyes:     General: No scleral icterus.       Right eye: No discharge.        Left eye: No discharge.     Conjunctiva/sclera: Conjunctivae normal.  Cardiovascular:     Rate and Rhythm: Normal rate and regular rhythm.  Pulmonary:     Effort: Pulmonary effort is normal. No respiratory distress.     Breath sounds: No wheezing.  Abdominal:     General: There is no distension.     Palpations: Abdomen is soft.  Musculoskeletal:        General: No tenderness. Normal range of motion.     Cervical back: Normal range of motion and neck supple.  Skin:    General: Skin is warm and dry.     Coloration: Skin is not pale.     Findings: No erythema or rash.  Neurological:     General: No focal  deficit present.     Mental Status: He is alert and oriented to person, place, and time.  Psychiatric:        Mood and Affect: Mood normal.        Behavior: Behavior normal.        Thought Content: Thought content normal.        Judgment: Judgment normal.          Assessment & Plan:  Recurrent likely persistent Helicobacter pylori infection:  Clinically he seems to have finally responded to therapy.  This gives me hope he may be cured.  He has not been on PPIs for the last several weeks and finished his therapy in June We will send off a Helicobacter pylori  stool antigen which he requested we send through [facility] because it will be for free there since he works for [employer]  Penicillin allergy: Clearly does not have penicillin allergy anymore since been able to take amoxicillin without difficulty.

## 2022-03-05 ENCOUNTER — Ambulatory Visit: Payer: Managed Care, Other (non HMO) | Admitting: Podiatry

## 2022-03-12 ENCOUNTER — Encounter: Payer: Self-pay | Admitting: Podiatry

## 2022-03-12 ENCOUNTER — Ambulatory Visit: Payer: Managed Care, Other (non HMO) | Admitting: Podiatry

## 2022-03-12 DIAGNOSIS — M7662 Achilles tendinitis, left leg: Secondary | ICD-10-CM | POA: Diagnosis not present

## 2022-03-12 DIAGNOSIS — R29898 Other symptoms and signs involving the musculoskeletal system: Secondary | ICD-10-CM | POA: Diagnosis not present

## 2022-03-12 DIAGNOSIS — S86899A Other injury of other muscle(s) and tendon(s) at lower leg level, unspecified leg, initial encounter: Secondary | ICD-10-CM

## 2022-03-12 DIAGNOSIS — M7661 Achilles tendinitis, right leg: Secondary | ICD-10-CM

## 2022-03-12 DIAGNOSIS — M1990 Unspecified osteoarthritis, unspecified site: Secondary | ICD-10-CM | POA: Insufficient documentation

## 2022-03-12 MED ORDER — GABAPENTIN 300 MG PO CAPS: 300.0000 mg | ORAL_CAPSULE | Freq: Three times a day (TID) | ORAL | 3 refills | Status: AC

## 2022-03-12 NOTE — Progress Notes (Signed)
Subjective:  Patient ID: Charles Guerra, male    DOB: 01/21/1991,  MRN: [id number]  Chief Complaint  Patient presents with   Foot Pain    31 y.o. male presents with the above complaint.  Patient presents with primary complaint of bilateral Achilles tendon that started over 3 years ago.  Patient states is painful to touch painful to walk on.  He plays soccer and has progressive gotten worse.  He also states that his coming up to his leg and causing him shinsplints as well as weakness of the leg 2.  States that it hurts with ambulation.  He states that pain scale 7 out of 10.  He has not seen anyone else prior to seeing me.  He went to go see a rheumatologist for which prednisone did help for his Achilles not really for his muscle pain.   Review of Systems: Negative except as noted in the HPI. Denies N/V/F/Ch.  Past Medical History:  Diagnosis Date   Cough 01/18/2021   Helicobacter pylori (H. pylori) infection 01/18/2021   Sore throat 01/18/2021   Urticaria     Current Outpatient Medications:    gabapentin (NEURONTIN) 300 MG capsule, Take 1 capsule (300 mg total) by mouth 3 (three) times daily., Disp: 90 capsule, Rfl: 3   azithromycin (ZITHROMAX) 250 MG tablet, TAKE 2 TABLETS BY MOUTH TODAY, THEN TAKE 1 TABLET DAILY FOR 4 DAYS, Disp: , Rfl:    Cholecalciferol (VITAMIN D3) 1.25 MG (50000 UT) CAPS, Take 1 capsule by mouth once a week., Disp: , Rfl:    finasteride (PROPECIA) 1 MG tablet, Take 1 mg by mouth daily., Disp: , Rfl:    fluticasone (FLONASE) 50 MCG/ACT nasal spray, 1 spray in each nostril, Disp: , Rfl:    levocetirizine (XYZAL) 5 MG tablet, TAKE 1 TABLET(5 MG) BY MOUTH EVERY EVENING, Disp: 30 tablet, Rfl: 0   levocetirizine (XYZAL) 5 MG tablet, 1 tablet in the evening, Disp: , Rfl:    montelukast (SINGULAIR) 10 MG tablet, SMARTSIG:1 Tablet(s) By Mouth Every Evening, Disp: , Rfl:    Multiple Vitamins-Minerals (VITAMIN D3 COMPLETE) TABS, See admin instructions., Disp: , Rfl:     omeprazole (PRILOSEC) 40 MG capsule, 1 capsule 30 minutes before morning meal, Disp: , Rfl:    predniSONE (DELTASONE) 10 MG tablet, Take by mouth., Disp: , Rfl:    tretinoin (RETIN-A) 0.025 % cream, SMARTSIG:sparingly Topical Every Night, Disp: , Rfl:    Vitamin D, Ergocalciferol, (DRISDOL) 1.25 MG (50000 UNIT) CAPS capsule, Take 50,000 Units by mouth every 7 (seven) days., Disp: , Rfl:   Social History   Tobacco Use  Smoking Status Never  Smokeless Tobacco Never    Allergies  Allergen Reactions   Penicillins Other (See Comments)    He had swelling of his eyes along with a rash as a child but has tolerated amoxicillin several times as an adult   Objective:  There were no vitals filed for this visit. There is no height or weight on file to calculate BMI. Constitutional Well developed. Well nourished.  Vascular Dorsalis pedis pulses palpable bilaterally. Posterior tibial pulses palpable bilaterally. Capillary refill normal to all digits.  No cyanosis or clubbing noted. Pedal hair growth normal.  Neurologic Normal speech. Oriented to person, place, and time. Epicritic sensation to light touch grossly present bilaterally.  Dermatologic Nails well groomed and normal in appearance. No open wounds. No skin lesions.  Orthopedic: Pain on palpation to the insertion of the Achilles tendon.  Positive Silfverskiold test noted  bilaterally with positive gastrocnemius equinus.  Haglund's deformity noted bilaterally.  Shinsplints clinically appreciated to both legs.  Negative tarsal tunnel or common peroneal nerve syndrome noted.   Radiographs: None Assessment:   1. Weakness of both lower extremities   2. Medial tibial stress syndrome, unspecified laterality, initial encounter   3. Achilles tendinitis, right leg   4. Achilles tendinitis, left leg    Plan:  Patient was evaluated and treated and all questions answered.  Bilateral Achilles tendinitis with underlying shinsplints/leg  weakness -All questions and concerns were discussed with the patient in extensive detail.  Given the amount of pain that she is experiencing I believe patient will benefit from mobilization of left lower extremity for next 4 weeks to allow it to heal appropriately.  He states understanding will do so immediatelyI will transition him to the right side if the left side helps with cam boot immobilization -Cam boot was dispensed -He will also benefit from nerve conduction study due to leg weakness -Nerve conduction study was ordered  No follow-ups on file.

## 2022-03-14 ENCOUNTER — Emergency Department (HOSPITAL_COMMUNITY)
Admission: EM | Admit: 2022-03-14 | Discharge: 2022-03-14 | Disposition: A | Discharge status: Home / Self Care | Payer: Managed Care, Other (non HMO) | Attending: Emergency Medicine | Admitting: Emergency Medicine

## 2022-03-14 ENCOUNTER — Emergency Department (HOSPITAL_COMMUNITY): Payer: Managed Care, Other (non HMO)

## 2022-03-14 ENCOUNTER — Encounter (HOSPITAL_COMMUNITY): Payer: Self-pay | Admitting: Emergency Medicine

## 2022-03-14 ENCOUNTER — Other Ambulatory Visit: Payer: Self-pay

## 2022-03-14 DIAGNOSIS — S52502A Unspecified fracture of the lower end of left radius, initial encounter for closed fracture: Secondary | ICD-10-CM | POA: Diagnosis not present

## 2022-03-14 DIAGNOSIS — S6992XA Unspecified injury of left wrist, hand and finger(s), initial encounter: Secondary | ICD-10-CM | POA: Diagnosis present

## 2022-03-14 DIAGNOSIS — Y9355 Activity, bike riding: Secondary | ICD-10-CM | POA: Diagnosis not present

## 2022-03-14 MED ORDER — OXYCODONE-ACETAMINOPHEN 5-325 MG PO TABS: 1.0000 | ORAL_TABLET | Freq: Four times a day (QID) | ORAL | 0 refills | Status: AC | PRN

## 2022-03-14 MED ORDER — OXYCODONE-ACETAMINOPHEN 5-325 MG PO TABS
1.0000 | ORAL_TABLET | Freq: Once | ORAL | Status: AC
  Administered 2022-03-14: 1 via ORAL
  Filled 2022-03-14: qty 1

## 2022-03-14 NOTE — Progress Notes (Signed)
Orthopedic Tech Progress Note Patient Details:  Charles Guerra Feb 23, 1991 [id number]  Ortho Devices Type of Ortho Device: Volar splint Ortho Device/Splint Location: lue Ortho Device/Splint Interventions: Ordered, Application, Adjustment   Post Interventions Patient Tolerated: Well  [name] 03/14/2022, 9:47 PM

## 2022-03-14 NOTE — Discharge Instructions (Addendum)
You have a distal radius fracture  Please keep the splint on.  Take Tylenol or Motrin for pain.  Apply ice for swelling  Please see hand surgery in a week for repeat x-rays  Return to ER if you have worse wrist pain, fingers turning numb

## 2022-03-14 NOTE — ED Triage Notes (Signed)
Pt c/o left forearm pain and wrist swelling after falling while playing soccer. Denies hitting his head, but thinks he passed out for 2 mins.

## 2022-03-14 NOTE — ED Provider Notes (Signed)
[facility] EMERGENCY DEPARTMENT Provider Note   CSN: [id number] Arrival date & time: 03/14/22  1925     History  Chief Complaint  Patient presents with   Arm Injury    Charles Guerra is a 31 y.o. male here presenting with left wrist pain.  Patient was playing soccer and landed on his wrist around 6 PM today.  He states that he noticed progressive swelling of the left wrist.  Denies any other injuries.   The history is provided by the patient.      Home Medications Prior to Admission medications   Medication Sig Start Date End Date Taking? Authorizing Provider  azithromycin (ZITHROMAX) 250 MG tablet TAKE 2 TABLETS BY MOUTH TODAY, THEN TAKE 1 TABLET DAILY FOR 4 DAYS 01/08/22   [provider]  Cholecalciferol (VITAMIN D3) 1.25 MG (50000 UT) CAPS Take 1 capsule by mouth once a week. 01/09/22   [provider]  finasteride (PROPECIA) 1 MG tablet Take 1 mg by mouth daily. 02/07/22   [provider]  fluticasone (FLONASE) 50 MCG/ACT nasal spray 1 spray in each nostril    [provider]  gabapentin (NEURONTIN) 300 MG capsule Take 1 capsule (300 mg total) by mouth 3 (three) times daily. 03/12/22   Candelaria Stagers, DPM  levocetirizine (XYZAL) 5 MG tablet TAKE 1 TABLET(5 MG) BY MOUTH EVERY EVENING 07/26/19   Ellamae Sia, DO  levocetirizine (XYZAL) 5 MG tablet 1 tablet in the evening    [provider]  montelukast (SINGULAIR) 10 MG tablet SMARTSIG:1 Tablet(s) By Mouth Every Evening 04/19/21   [provider]  Multiple Vitamins-Minerals (VITAMIN D3 COMPLETE) TABS See admin instructions.    [provider]  omeprazole (PRILOSEC) 40 MG capsule 1 capsule 30 minutes before morning meal    [provider]  predniSONE (DELTASONE) 10 MG tablet Take by mouth. 02/13/22   [provider]  tretinoin (RETIN-A) 0.025 % cream SMARTSIG:sparingly Topical Every Night 01/21/22   [provider]  Vitamin  D, Ergocalciferol, (DRISDOL) 1.25 MG (50000 UNIT) CAPS capsule Take 50,000 Units by mouth every 7 (seven) days.    [provider]      Allergies    Penicillins    Review of Systems   Review of Systems  Musculoskeletal:        L wrist pain   All other systems reviewed and are negative.  Physical Exam Updated Vital Signs BP 123/83 (BP Location: Right Arm)   Pulse (!) 101   Temp 98.3 F (36.8 C) (Oral)   Resp 18   SpO2 95%  Physical Exam Vitals and nursing note reviewed.  HENT:     Head: Normocephalic.     Nose: Nose normal.     Mouth/Throat:     Mouth: Mucous membranes are moist.  Eyes:     Pupils: Pupils are equal, round, and reactive to light.  Cardiovascular:     Rate and Rhythm: Normal rate.     Pulses: Normal pulses.  Pulmonary:     Effort: Pulmonary effort is normal.  Abdominal:     General: Abdomen is flat.  Musculoskeletal:     Cervical back: Normal range of motion.     Comments: L wrist slightly swollen. 2+ radial pulse.  Patient able to hand grasp.  Skin:    General: Skin is warm.  Neurological:     General: No focal deficit present.     Mental Status: He is alert and oriented to  person, place, and time.  Psychiatric:        Mood and Affect: Mood normal.        Behavior: Behavior normal.    ED Results / Procedures / Treatments   Labs (all labs ordered are listed, but only abnormal results are displayed) Labs Reviewed - No data to display  EKG None  Radiology DG Wrist Complete Left  Result Date: 03/14/2022 CLINICAL DATA:  Status post fall. EXAM: LEFT WRIST - COMPLETE 3+ VIEW COMPARISON:  None Available. FINDINGS: An acute, nondisplaced fracture deformity is seen involving the distal left radius, along the radiocarpal articulation. There is no evidence of dislocation. There is no evidence of arthropathy or other focal bone abnormality. Mild diffuse soft tissue swelling is seen. IMPRESSION: Acute, nondisplaced fracture of the distal left  radius. Electronically Signed   By: Aram Candela M.D.   On: 03/14/2022 20:54    Procedures Procedures    Medications Ordered in ED Medications  oxyCODONE-acetaminophen (PERCOCET/ROXICET) 5-325 MG per tablet 1 tablet (has no administration in time range)    ED Course/ Medical Decision Making/ A&P                           Medical Decision Making Charles Guerra is a 31 y.o. male here presenting with left wrist pain after injury from soccer.  Patient's left wrist is swollen.  I reviewed and independently interpreted his x-ray.  It showed the distal radius fracture that is nondisplaced.  Patient is placed in a volar splint.  We will have him follow-up with hand surgery and give short course of pain medicine.     Problems Addressed: Closed fracture of distal end of left radius, unspecified fracture morphology, initial encounter: acute illness or injury  Amount and/or Complexity of Data Reviewed Radiology: ordered and independent interpretation performed. Decision-making details documented in ED Course.  Risk Prescription drug management.    Final Clinical Impression(s) / ED Diagnoses Final diagnoses:  None    Rx / DC Orders ED Discharge Orders     None         Charlynne Pander, MD 03/14/22 2139

## 2022-03-14 NOTE — ED Provider Triage Note (Signed)
Emergency Medicine Provider Triage Evaluation Note  Azariah Bonura , a 31 y.o. male  was evaluated in triage.  Pt complains of left wrist pain after falling onto it playing soccer.  Patient has swelling noted.  Sensation intact.  2+ radial pulse.  Less than 2-second capillary refill.  Review of Systems  Positive:  Negative:   Physical Exam  BP 123/83 (BP Location: Right Arm)   Pulse (!) 101   Temp 98.3 F (36.8 C) (Oral)   Resp 18   SpO2 95%  Gen:   Awake, no distress   Resp:  Normal effort  MSK:   Moves extremities without difficulty  Other:    Medical Decision Making  Medically screening exam initiated at 7:54 PM.  Appropriate orders placed.  Mounir Caperton was informed that the remainder of the evaluation will be completed by another provider, this initial triage assessment does not replace that evaluation, and the importance of remaining in the ED until their evaluation is complete.     [name], PA-C 03/14/22 1955

## 2022-03-21 ENCOUNTER — Ambulatory Visit: Payer: Managed Care, Other (non HMO) | Admitting: Orthopedic Surgery

## 2022-03-21 ENCOUNTER — Encounter: Payer: Self-pay | Admitting: Orthopedic Surgery

## 2022-03-21 ENCOUNTER — Ambulatory Visit (INDEPENDENT_AMBULATORY_CARE_PROVIDER_SITE_OTHER): Payer: Managed Care, Other (non HMO)

## 2022-03-21 VITALS — BP 129/84 | HR 75 | Ht 71.0 in | Wt 203.0 lb

## 2022-03-21 DIAGNOSIS — M25532 Pain in left wrist: Secondary | ICD-10-CM

## 2022-03-21 DIAGNOSIS — S52572A Other intraarticular fracture of lower end of left radius, initial encounter for closed fracture: Secondary | ICD-10-CM

## 2022-03-21 DIAGNOSIS — S52502A Unspecified fracture of the lower end of left radius, initial encounter for closed fracture: Secondary | ICD-10-CM | POA: Insufficient documentation

## 2022-03-21 NOTE — Progress Notes (Signed)
Office Visit Note   Patient: Charles Guerra           Date of Birth: 13-Apr-1991           MRN: [id number] Visit Date: 03/21/2022              Requested by: Ralene Ok, MD [address] [place],  [place] [address] PCP: Ralene Ok, MD   Assessment & Plan: Visit Diagnoses:  1. Pain in left wrist   2. Other closed intra-articular fracture of distal end of left radius, initial encounter     Plan: Patient's x-rays today are difficult to interpret as there is no easy to follow fracture line.  There is maintained radial height, inclination, and volar tilt.  The lateral view does not demonstrate an obvious volar ulnar corner or lunate facet fracture with no change in the teardrop.  We reviewed his previous x-rays which appear to show a nondisplaced fracture through the joint between the scaphoid and lunate facets.  He has no pain with flexion, extension, pronation, or supination today.  We will treat him in a short arm cast.  I would like to see him next week for a repeat x-ray just to make sure there has been no fracture displacement.  Follow-Up Instructions: No follow-ups on file.   Orders:  Orders Placed This Encounter  Procedures   XR Wrist Complete Left   No orders of the defined types were placed in this encounter.     Procedures: Casting  Date/Time: 03/21/2022 3:33 PM  Performed by: Marlyne Beards, MD Authorized by: Marlyne Beards, MD   Consent Given by:  Patient Location:  Wrist  left wrist Fracture Type: distal radius   Neurovascularly intact   Distal Perfusion: normal   Distal Sensation: normal   Manipulation Performed?: No   Immobilization:  Cast Is this the patient's first cast for this injury?: Yes   Cast Type:  Short arm Supplies Used:  Fiberglass and cotton padding Neurovascularly intact   Distal Perfusion: normal   Distal Sensation: normal   Patient tolerance:  Patient tolerated the procedure well with no immediate  complications    Clinical Data: No additional findings.   Subjective: Chief Complaint  Patient presents with   Left Wrist - Fracture    RIGHT Handed, Pain:5-6/10, DOI: 03/14/22, fell playing soccer,     This is a 31 year old right-hand-dominant male who presents for follow-up of a left distal radius fracture.  Patient was playing soccer last week when he fell and landed on his flexed left wrist.  He is seen in the ER where x-rays were obtained which suggested an intra-articular distal radius fracture.  He was placed in a splint and referred to my office for follow-up.  Today he reports that he has no pain with range of motion of the wrist.  He has full pronation supination without pain.  He denies any numbness or paresthesias in his fingers.    Review of Systems   Objective: Vital Signs: BP 129/84 (BP Location: Right Arm, Patient Position: Sitting)   Pulse 75   Ht 5\' 11"  (1.803 m)   Wt 203 lb (92.1 kg)   BMI 28.31 kg/m   Physical Exam Constitutional:      Appearance: Normal appearance.  Cardiovascular:     Rate and Rhythm: Normal rate.     Pulses: Normal pulses.  Pulmonary:     Effort: Pulmonary effort is normal.  Skin:    General: Skin is warm and dry.  Capillary Refill: Capillary refill takes less than 2 seconds.  Neurological:     Mental Status: He is alert.     Left Hand Exam   Tenderness  Left hand tenderness location: Mildly TTP across dorsal wrist.   Other  Erythema: absent Sensation: normal Pulse: present  Comments:  Intact flexion/extension and pronation/supination without pain.  No pain w/ radial/ulnar deviation of wrist.  SILT throughout hand.  Able to make a full and complete fist.       Specialty Comments:  No specialty comments available.  Imaging: No results found.   PMFS History: Patient Active Problem List   Diagnosis Date Noted   Distal radius fracture, left 03/21/2022   Inflammatory arthritis 03/12/2022   Helicobacter  pylori (H. pylori) infection 01/18/2021   Sore throat 01/18/2021   Cough 01/18/2021   Chronic urticaria 09/03/2018   Allergic urticaria 07/14/2018   Chronic rhinitis 07/14/2018   Allergic reaction 07/14/2018   Past Medical History:  Diagnosis Date   Cough 01/18/2021   Helicobacter pylori (H. pylori) infection 01/18/2021   Sore throat 01/18/2021   Urticaria     Family History  Problem Relation Age of Onset   Allergic rhinitis Neg Hx    Asthma Neg Hx    Eczema Neg Hx    Urticaria Neg Hx     No past surgical history on file. Social History   Occupational History   Not on file  Tobacco Use   Smoking status: Never   Smokeless tobacco: Never  Vaping Use   Vaping Use: Never used  Substance and Sexual Activity   Alcohol use: Not on file   Drug use: Not on file   Sexual activity: Not on file

## 2022-03-28 ENCOUNTER — Ambulatory Visit (INDEPENDENT_AMBULATORY_CARE_PROVIDER_SITE_OTHER): Payer: Managed Care, Other (non HMO) | Admitting: Orthopedic Surgery

## 2022-03-28 DIAGNOSIS — S52572A Other intraarticular fracture of lower end of left radius, initial encounter for closed fracture: Secondary | ICD-10-CM

## 2022-03-28 NOTE — Progress Notes (Signed)
Office Visit Note   Patient: Charles Guerra           Date of Birth: 05-30-1991           MRN: [id number] Visit Date: 03/28/2022              Requested by: Ralene Ok, MD [address] [place],  [place] [address] PCP: Ralene Ok, MD   Assessment & Plan: Visit Diagnoses:  1. Other closed intra-articular fracture of distal end of left radius, initial encounter     Plan: Patient refused a formal repeat x-rays left distal radius.  I examined his radius under live fluoroscopy did not see any apparent fracture line or displacement.  He also does not want to maintain the cast.  He would like to be transition to a removable brace.  Discussed that the cast provides most protection and would best prevent any displacement if there was an occult fracture present.  He understands and would like to transition to a removable brace.  I will see him back next week for repeat x-ray.  Follow-Up Instructions: No follow-ups on file.   Orders:  No orders of the defined types were placed in this encounter.  No orders of the defined types were placed in this encounter.     Procedures: No procedures performed   Clinical Data: No additional findings.   Subjective: Chief Complaint  Patient presents with   Left Wrist - Follow-up, Fracture    This is a 31 year old right-hand-dominant male who presents for follow-up of a left distal radius fracture.  Splint soccer 2 weeks ago he landed on his left wrist.  X-rays obtained in the ER at that time suggested an intra-articular distal radius fracture.  Repeat x-rays in the office last week did not demonstrate any obvious fracture.  He was tender to the radius but had full and painless range of motion.  We put him in a short arm cast at that point.  Today he would like the cast removed.  He has minimal pain today compared to previous.  He denies any numbness or paresthesias in his fingers.    Review of Systems   Objective: Vital Signs: There  were no vitals taken for this visit.  Physical Exam Constitutional:      Appearance: Normal appearance.  Cardiovascular:     Rate and Rhythm: Normal rate.     Pulses: Normal pulses.  Pulmonary:     Effort: Pulmonary effort is normal.  Skin:    General: Skin is warm and dry.  Neurological:     Mental Status: He is alert.     Right Hand Exam   Other  Erythema: absent Sensation: normal Pulse: present  Comments:  Cast clean and dry.  Able to flex and extend all fingers.  SILT throughout fingers and hand.       Specialty Comments:  No specialty comments available.  Imaging: No results found.   PMFS History: Patient Active Problem List   Diagnosis Date Noted   Distal radius fracture, left 03/21/2022   Inflammatory arthritis 03/12/2022   Helicobacter pylori (H. pylori) infection 01/18/2021   Sore throat 01/18/2021   Cough 01/18/2021   Chronic urticaria 09/03/2018   Allergic urticaria 07/14/2018   Chronic rhinitis 07/14/2018   Allergic reaction 07/14/2018   Past Medical History:  Diagnosis Date   Cough 01/18/2021   Helicobacter pylori (H. pylori) infection 01/18/2021   Sore throat 01/18/2021   Urticaria     Family History  Problem  Relation Age of Onset   Allergic rhinitis Neg Hx    Asthma Neg Hx    Eczema Neg Hx    Urticaria Neg Hx     No past surgical history on file. Social History   Occupational History   Not on file  Tobacco Use   Smoking status: Never   Smokeless tobacco: Never  Vaping Use   Vaping Use: Never used  Substance and Sexual Activity   Alcohol use: Not on file   Drug use: Not on file   Sexual activity: Not on file

## 2022-04-05 ENCOUNTER — Ambulatory Visit: Payer: Managed Care, Other (non HMO) | Admitting: Orthopedic Surgery

## 2022-04-18 ENCOUNTER — Ambulatory Visit: Payer: Managed Care, Other (non HMO) | Admitting: Podiatry

## 2022-04-23 ENCOUNTER — Ambulatory Visit: Payer: Managed Care, Other (non HMO) | Admitting: Podiatry

## 2022-06-27 IMAGING — CR DG WRIST COMPLETE 3+V*L*
4 series · 4 of 4 positions shown · non-contrast
Comparison: None Available.

CLINICAL DATA: Status post fall.

EXAM:
LEFT WRIST - COMPLETE 3+ VIEW

[wrist pa]
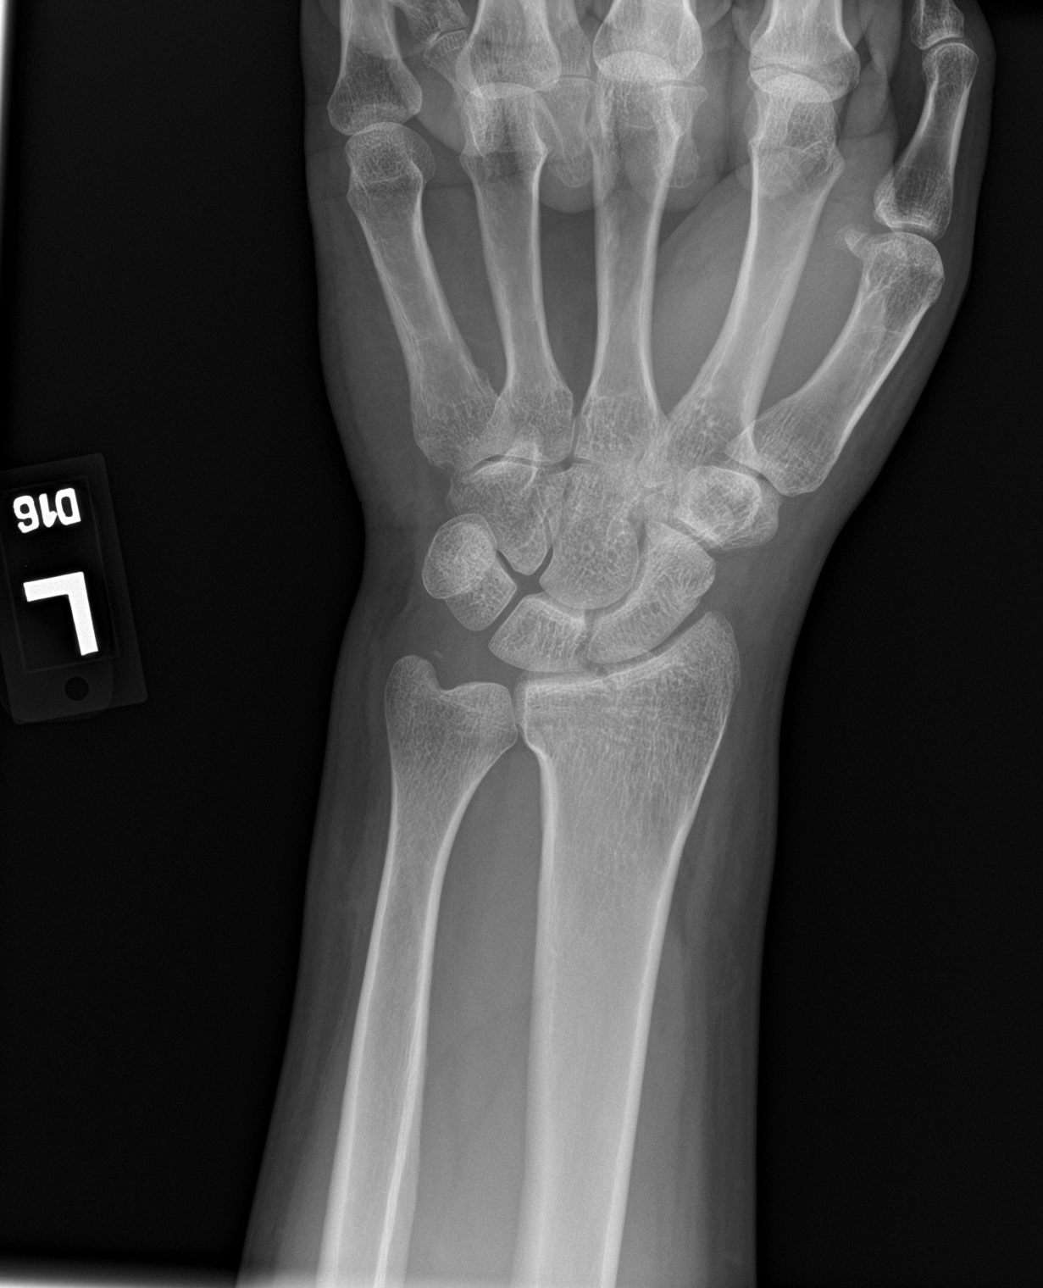

[wrist obl]
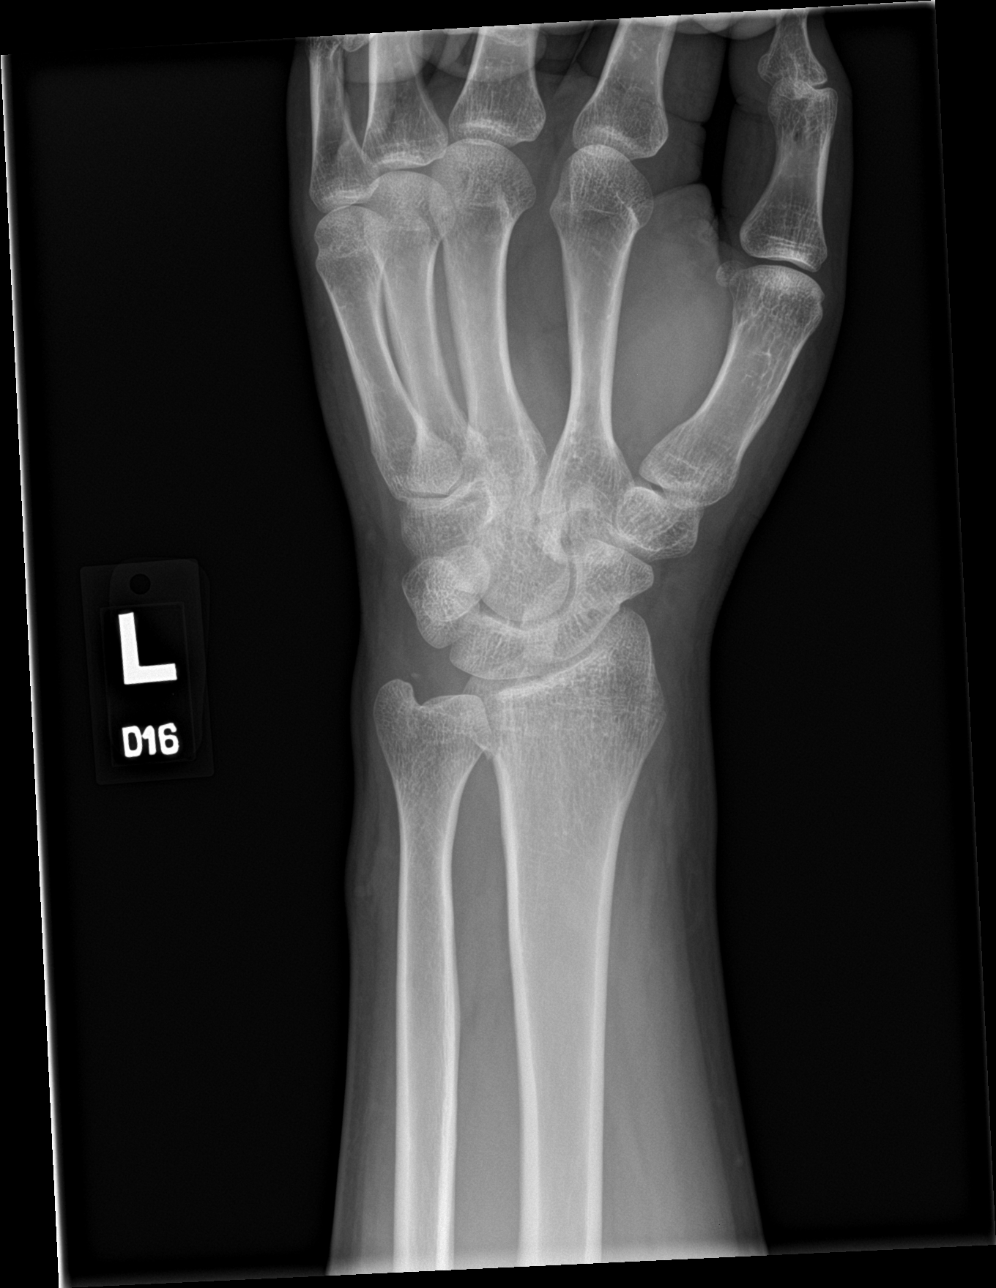

[wrist lat]
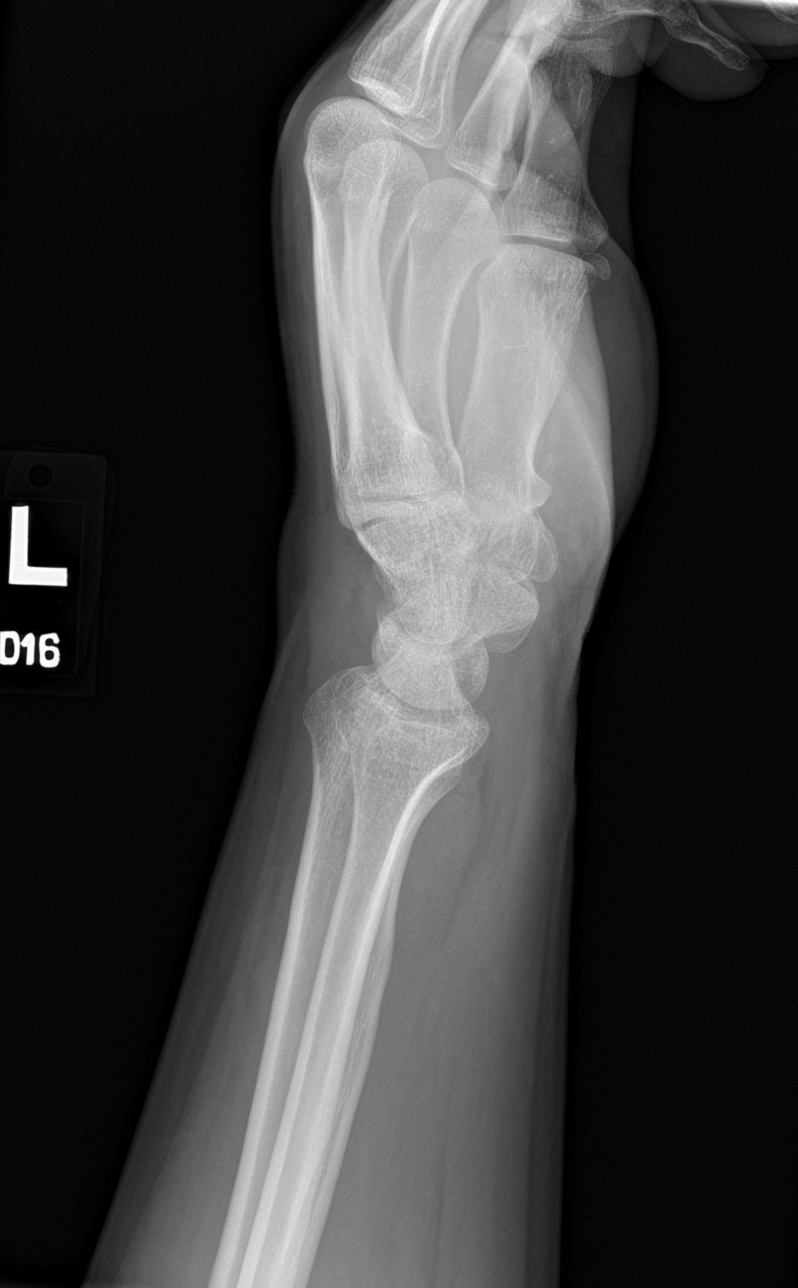

[wrist navicular]
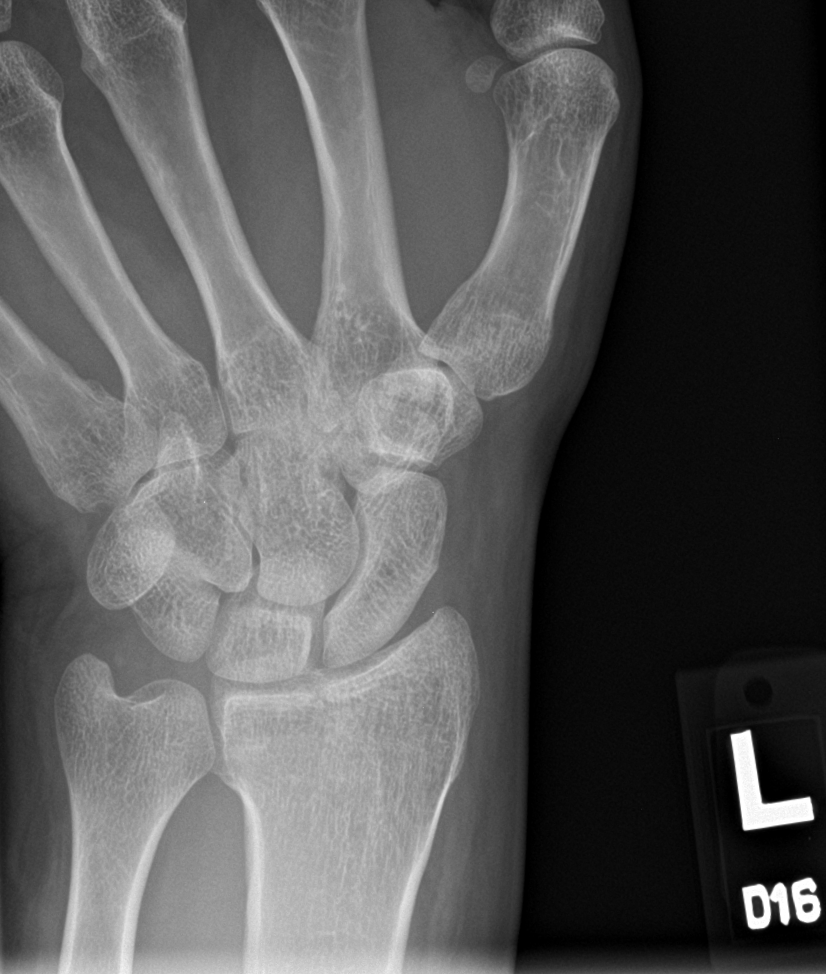

[4 of 4 positions shown; findings below may reference images not displayed]

FINDINGS: An acute, nondisplaced fracture deformity is seen involving the
distal left radius, along the radiocarpal articulation. There is no
evidence of dislocation. There is no evidence of arthropathy or
other focal bone abnormality. Mild diffuse soft tissue swelling is
seen.
IMPRESSION: Acute, nondisplaced fracture of the distal left radius.

## 2024-09-27 ENCOUNTER — Other Ambulatory Visit: Payer: Self-pay

## 2024-09-27 ENCOUNTER — Emergency Department
Admission: EM | Admit: 2024-09-27 | Discharge: 2024-09-27 | Discharge status: Against Medical Advice | Attending: Emergency Medicine | Admitting: Emergency Medicine

## 2024-09-27 DIAGNOSIS — M791 Myalgia, unspecified site: Secondary | ICD-10-CM | POA: Diagnosis not present

## 2024-09-27 DIAGNOSIS — R109 Unspecified abdominal pain: Secondary | ICD-10-CM | POA: Diagnosis present

## 2024-09-27 DIAGNOSIS — Z5321 Procedure and treatment not carried out due to patient leaving prior to being seen by health care provider: Secondary | ICD-10-CM | POA: Diagnosis not present

## 2024-09-27 DIAGNOSIS — R61 Generalized hyperhidrosis: Secondary | ICD-10-CM | POA: Diagnosis not present

## 2024-09-27 DIAGNOSIS — R112 Nausea with vomiting, unspecified: Secondary | ICD-10-CM | POA: Diagnosis not present

## 2024-09-27 DIAGNOSIS — R197 Diarrhea, unspecified: Secondary | ICD-10-CM | POA: Diagnosis not present

## 2024-09-27 DIAGNOSIS — R55 Syncope and collapse: Secondary | ICD-10-CM | POA: Diagnosis not present

## 2024-09-27 LAB — COMPREHENSIVE METABOLIC PANEL WITH GFR
ALT: 31 U/L (ref 0–44)
AST: 28 U/L (ref 15–41)
Albumin: 4.5 g/dL (ref 3.5–5.0)
Alkaline Phosphatase: 86 U/L (ref 38–126)
Anion gap: 11 (ref 5–15)
BUN: 15 mg/dL (ref 6–20)
CO2: 25 mmol/L (ref 22–32)
Calcium: 9 mg/dL (ref 8.9–10.3)
Chloride: 104 mmol/L (ref 98–111)
Creatinine, Ser: 0.97 mg/dL (ref 0.61–1.24)
GFR, Estimated: >60 mL/min (ref >60)
Glucose, Bld: 144 mg/dL — ABNORMAL HIGH (ref 70–99)
Potassium: 4 mmol/L (ref 3.5–5.1)
Sodium: 140 mmol/L (ref 135–145)
Total Bilirubin: 0.7 mg/dL (ref 0.0–1.2)
Total Protein: 7.4 g/dL (ref 6.5–8.1)

## 2024-09-27 LAB — CBC
HCT: 46.6 % (ref 39.0–52.0)
Hemoglobin: 16 g/dL (ref 13.0–17.0)
MCH: 28.8 pg (ref 26.0–34.0)
MCHC: 34.3 g/dL (ref 30.0–36.0)
MCV: 83.8 fL (ref 80.0–100.0)
Platelets: 213 K/uL (ref 150–400)
RBC: 5.56 MIL/uL (ref 4.22–5.81)
RDW: 12.8 % (ref 11.5–15.5)
WBC: 12.5 K/uL — ABNORMAL HIGH (ref 4.0–10.5)
nRBC: 0 % (ref 0.0–0.2)

## 2024-09-27 LAB — LIPASE, BLOOD: Lipase: 34 U/L (ref 11–51)

## 2024-09-27 NOTE — ED Triage Notes (Signed)
 Pt to ED via [facility] from home. Pt reports abd pain, N/V/D and generalized body aches. Pt reports near syncopal episode upon standing and felt diaphoretic.  126/74 HR 80 Cbg 114 98% RA
